# Patient Record
Sex: Male | Born: 1995 | Race: White | Hispanic: No | Marital: Single | Smoking: Former smoker
Health system: Southern US, Community
[De-identification: ages and names within clinical notes are randomized; demographics above are authoritative.]

## PROBLEM LIST (undated history)

## (undated) DIAGNOSIS — K219 Gastro-esophageal reflux disease without esophagitis: Secondary | ICD-10-CM

## (undated) DIAGNOSIS — I10 Essential (primary) hypertension: Secondary | ICD-10-CM

## (undated) DIAGNOSIS — F419 Anxiety disorder, unspecified: Secondary | ICD-10-CM

## (undated) HISTORY — PX: HAND SURGERY: SHX662

## (undated) HISTORY — PX: TONSILLECTOMY: SUR1361

---

## 2018-07-13 ENCOUNTER — Encounter (HOSPITAL_COMMUNITY): Payer: Self-pay | Admitting: Obstetrics and Gynecology

## 2018-07-13 ENCOUNTER — Emergency Department (HOSPITAL_COMMUNITY): Payer: BLUE CROSS/BLUE SHIELD

## 2018-07-13 ENCOUNTER — Emergency Department (HOSPITAL_COMMUNITY)
Admission: EM | Admit: 2018-07-13 | Discharge: 2018-07-13 | Disposition: A | Discharge status: Home / Self Care | Payer: BLUE CROSS/BLUE SHIELD | Attending: Emergency Medicine | Admitting: Emergency Medicine

## 2018-07-13 ENCOUNTER — Other Ambulatory Visit: Payer: Self-pay

## 2018-07-13 DIAGNOSIS — I10 Essential (primary) hypertension: Secondary | ICD-10-CM | POA: Diagnosis not present

## 2018-07-13 DIAGNOSIS — R202 Paresthesia of skin: Secondary | ICD-10-CM | POA: Insufficient documentation

## 2018-07-13 DIAGNOSIS — F17210 Nicotine dependence, cigarettes, uncomplicated: Secondary | ICD-10-CM | POA: Diagnosis not present

## 2018-07-13 HISTORY — DX: Gastro-esophageal reflux disease without esophagitis: K21.9

## 2018-07-13 HISTORY — DX: Anxiety disorder, unspecified: F41.9

## 2018-07-13 HISTORY — DX: Essential (primary) hypertension: I10

## 2018-07-13 LAB — CBC WITH DIFFERENTIAL/PLATELET
Basophils Absolute: 0 10*3/uL (ref 0.0–0.1)
Basophils Relative: 1 %
EOS PCT: 4 %
Eosinophils Absolute: 0.2 10*3/uL (ref 0.0–0.7)
HCT: 45.2 % (ref 39.0–52.0)
Hemoglobin: 15.7 g/dL (ref 13.0–17.0)
LYMPHS ABS: 1.5 10*3/uL (ref 0.7–4.0)
Lymphocytes Relative: 32 %
MCH: 30.7 pg (ref 26.0–34.0)
MCHC: 34.7 g/dL (ref 30.0–36.0)
MCV: 88.5 fL (ref 78.0–100.0)
MONO ABS: 0.4 10*3/uL (ref 0.1–1.0)
MONOS PCT: 9 %
Neutro Abs: 2.7 10*3/uL (ref 1.7–7.7)
Neutrophils Relative %: 56 %
PLATELETS: 220 10*3/uL (ref 150–400)
RBC: 5.11 MIL/uL (ref 4.22–5.81)
RDW: 12.4 % (ref 11.5–15.5)
WBC: 4.9 10*3/uL (ref 4.0–10.5)

## 2018-07-13 LAB — BASIC METABOLIC PANEL
Anion gap: 10 (ref 5–15)
BUN: 10 mg/dL (ref 6–20)
CO2: 27 mmol/L (ref 22–32)
Calcium: 9.4 mg/dL (ref 8.9–10.3)
Chloride: 105 mmol/L (ref 98–111)
Creatinine, Ser: 1.07 mg/dL (ref 0.61–1.24)
GFR calc Af Amer: >60 mL/min (ref >60)
GLUCOSE: 92 mg/dL (ref 70–99)
Potassium: 4.1 mmol/L (ref 3.5–5.1)
Sodium: 142 mmol/L (ref 135–145)

## 2018-07-13 LAB — URINALYSIS, ROUTINE W REFLEX MICROSCOPIC
Bilirubin Urine: NEGATIVE
GLUCOSE, UA: NEGATIVE mg/dL
HGB URINE DIPSTICK: NEGATIVE
Ketones, ur: NEGATIVE mg/dL
Leukocytes, UA: NEGATIVE
Nitrite: NEGATIVE
PROTEIN: NEGATIVE mg/dL
Specific Gravity, Urine: 1.011 (ref 1.005–1.030)
pH: 7 (ref 5.0–8.0)

## 2018-07-13 MED ORDER — DIPHENHYDRAMINE HCL 25 MG PO CAPS
25.0000 mg | ORAL_CAPSULE | Freq: Once | ORAL | Status: AC
  Administered 2018-07-13: 25 mg via ORAL
  Filled 2018-07-13: qty 1

## 2018-07-13 MED ORDER — LORAZEPAM 2 MG/ML IJ SOLN
1.0000 mg | Freq: Once | INTRAMUSCULAR | Status: AC | PRN
  Administered 2018-07-13: 1 mg via INTRAVENOUS
  Filled 2018-07-13: qty 1

## 2018-07-13 MED ORDER — PROCHLORPERAZINE MALEATE 10 MG PO TABS
10.0000 mg | ORAL_TABLET | Freq: Once | ORAL | Status: AC
  Administered 2018-07-13: 10 mg via ORAL
  Filled 2018-07-13: qty 1

## 2018-07-13 MED ORDER — GADOBENATE DIMEGLUMINE 529 MG/ML IV SOLN
20.0000 mL | Freq: Once | INTRAVENOUS | Status: AC | PRN
  Administered 2018-07-13: 17 mL via INTRAVENOUS

## 2018-07-13 NOTE — ED Notes (Addendum)
PA in with pt, unable to place pt on monitor.

## 2018-07-13 NOTE — ED Notes (Signed)
Pt transported to MRI 

## 2018-07-13 NOTE — ED Notes (Signed)
MRI called and stated they would be getting pt soon

## 2018-07-13 NOTE — Discharge Instructions (Signed)
Your blood work and MRI were reassuring.  Please follow up with neurology. Call tomorrow to schedule an appointment. Establish care with a PCP.  If you develop worsening or new concerning symptoms you can return to the emergency department for re-evaluation.

## 2018-07-13 NOTE — ED Triage Notes (Signed)
Per Pt: Pt reports he has been having weird tingling sensations on the left side of his body since yesterday. Pt reports he was trying to see if it would stop so he waited until today to come in. Pt reports it is mostly in the left arm and leg, but he feels it in his face as well. Pt reports he has a hx of Htn and anxiety.  Pt reports he has been drinking and smoking more the past few weeks since he started his [employer] job.  Pt is alert and ambulatory at triage

## 2018-07-13 NOTE — ED Provider Notes (Signed)
[facility]-EMERGENCY DEPT Provider Note   CSN: [id number] Arrival date & time: 07/13/18  1239     History   Chief Complaint Chief Complaint  Patient presents with  . Tingling    Left arm and leg    HPI Dalton Johnson is a 22 y.o. male with a past medical history of hypertension, GERD, anxiety who presents emergency department today for tingling.  Patient reports that he has had intermittent episodes of tingling of his left face, left arm as well as left leg in the last several months.  He reports that yesterday evening he had the onset of his symptoms again but instead of only lasting a few hours if now persistent today.  He notes that there is no associated headache, visual changes, vertigo, dizziness, syncope, facial droop, difficulty with speech, tinnitus, head trauma, fall, chest pain, shortness of breath, cough, abdominal pain, nausea/vomiting/diarrhea, extremity weakness, neck pain.  He reports that he he takes clonidine for hypertension and omeprazole for GERD but no other medications.  He denies any illicit drug use.  He has alcohol use last week but none recently.  He denies any over-the-counter medication.  He does nothing makes symptoms better or worse.  No interventions prior to arrival.  He denies any pain at this present time.  HPI  Past Medical History:  Diagnosis Date  . Anxiety   . GERD (gastroesophageal reflux disease)   . Hypertension     There are no active problems to display for this patient.   Past Surgical History:  Procedure Laterality Date  . HAND SURGERY    . TONSILLECTOMY          Home Medications    Prior to Admission medications   Not on File    Family History No family history on file.  Social History Social History   Tobacco Use  . Smoking status: Current Every Day Smoker    Packs/day: 1.00    Years: 5.00    Pack years: 5.00    Types: Cigarettes  . Smokeless tobacco: Never Used  Substance Use Topics  .  Alcohol use: Yes    Alcohol/week: 5.0 standard drinks    Types: 5 Cans of beer per week  . Drug use: Not Currently     Allergies   Patient has no allergy information on record.   Review of Systems Review of Systems  All other systems reviewed and are negative.    Physical Exam Updated Vital Signs BP (!) 149/99 (BP Location: Left Arm)   Pulse 86   Temp 97.8 F (36.6 C) (Oral)   Resp 16   SpO2 98%   Physical Exam  Constitutional: He appears well-developed and well-nourished.  HENT:  Head: Normocephalic and atraumatic.  Right Ear: External ear normal.  Left Ear: External ear normal.  Nose: Nose normal.  Mouth/Throat: Uvula is midline, oropharynx is clear and moist and mucous membranes are normal. No tonsillar exudate.  Eyes: Pupils are equal, round, and reactive to light. Right eye exhibits no discharge. Left eye exhibits no discharge. No scleral icterus.  Neck: Trachea normal. Neck supple. No spinous process tenderness present. No neck rigidity. Normal range of motion present.  No C-spine tenderness palpation or step-offs.  No carotid bruit.  Cardiovascular: Normal rate, regular rhythm and intact distal pulses.  No murmur heard. Pulses:      Radial pulses are 2+ on the right side, and 2+ on the left side.       Dorsalis  pedis pulses are 2+ on the right side, and 2+ on the left side.       Posterior tibial pulses are 2+ on the right side, and 2+ on the left side.  No lower extremity swelling or edema. Calves symmetric in size bilaterally.  Pulmonary/Chest: Effort normal and breath sounds normal. He exhibits no tenderness.  Abdominal: Soft. Bowel sounds are normal. There is no tenderness. There is no rigidity, no rebound, no guarding and no CVA tenderness.  Musculoskeletal: He exhibits no edema.  Lymphadenopathy:    He has no cervical adenopathy.  Neurological: He is alert.  Mental Status:  Alert, oriented, thought content appropriate, able to give a coherent history.  Speech fluent without evidence of aphasia. Able to follow 2 step commands without difficulty.  Cranial Nerves:  II:  Peripheral visual fields grossly normal, pupils equal, round, reactive to light III,IV, VI: ptosis not present, extra-ocular motions intact bilaterally  V,VII: smile symmetric, eyebrows raise symmetric, facial light touch sensation equal VIII: hearing grossly normal to voice  X: uvula elevates symmetrically  XI: bilateral shoulder shrug symmetric and strong XII: midline tongue extension without fassiculations Motor:  Normal tone. 5/5 in upper and lower extremities bilaterally including strong and equal grip strength and dorsiflexion/plantar flexion Sensory: Sensation intact to light touch in all extremities. Negative Romberg.  Deep Tendon Reflexes: 2+ and symmetric in the biceps and patella Cerebellar: normal finger-to-nose with bilateral upper extremities. Normal heel-to -shin balance bilaterally of the lower extremity. No pronator drift.  Gait: normal gait and balance CV: distal pulses palpable throughout   Skin: Skin is warm and dry. No rash noted. He is not diaphoretic.  Psychiatric: He has a normal mood and affect.  Nursing note and vitals reviewed.    ED Treatments / Results  Labs (all labs ordered are listed, but only abnormal results are displayed) Labs Reviewed - No data to display  EKG None  Radiology Mr Laqueta Jean And Wo Contrast  Result Date: 07/13/2018 CLINICAL DATA:  Intermittent numbness of the left face, upper, and lower extremity over last several months. Left-sided tingling sensation since yesterday. EXAM: MRI HEAD WITHOUT AND WITH CONTRAST TECHNIQUE: Multiplanar, multiecho pulse sequences of the brain and surrounding structures were obtained without and with intravenous contrast. CONTRAST:  17mL MULTIHANCE GADOBENATE DIMEGLUMINE 529 MG/ML IV SOLN COMPARISON:  None. FINDINGS: Brain: No acute infarct, hemorrhage, or mass lesion is present. The ventricles  are of normal size. No significant extraaxial fluid collection is present. No significant white matter disease is present. The brainstem and cerebellum are normal. The postcontrast images demonstrate no pathologic enhancement. Vascular: Flow is present in the major intracranial arteries. Skull and upper cervical spine: The skull base is within normal limits. Craniocervical junction is normal. The upper cervical spine is within normal. Marrow signal is unremarkable. Sinuses/Orbits: Diffuse mucosal thickening is present throughout the paranasal sinuses. No fluid levels are present. Minimal fluid is present in the inferior mastoid air cells. No obstructing nasopharyngeal lesion is present. IMPRESSION: 1. Normal MRI the brain. No acute or focal lesion to explain numbness or tingling sensations. 2. Mild diffuse sinus disease as described. Electronically Signed   By: Marin Roberts M.D.   On: 07/13/2018 16:50    Procedures Procedures (including critical care time)  Medications Ordered in ED Medications  diphenhydrAMINE (BENADRYL) capsule 25 mg (25 mg Oral Given 07/13/18 1350)  prochlorperazine (COMPAZINE) tablet 10 mg (10 mg Oral Given 07/13/18 1350)  LORazepam (ATIVAN) injection 1 mg (1 mg Intravenous Given  07/13/18 1524)  gadobenate dimeglumine (MULTIHANCE) injection 20 mL (17 mLs Intravenous Contrast Given 07/13/18 1616)     Initial Impression / Assessment and Plan / ED Course  I have reviewed the triage vital signs and the nursing notes.  Pertinent labs & imaging results that were available during my care of the patient were reviewed by me and considered in my medical decision making (see chart for details).     22 year old male with intermittent left-sided facial, arm and leg numbness for the last several months.  He reports symptoms started yesterday and have progressed since today.  He denies any visual changes or focal weakness.  He has normal neurologic exam.  Blood work is reassuring.  He  was given Compazine in the department for possible complicated migraine.  After discussion with my attending, Dr. Madilyn Hook will obtain MRI with and without contrast to evaluate.  If negative will have follow-up with neurology as an outpatient.  MRI is negative. These were reviewed by myself. Patient with resolution of symptoms.  Patient feels reassured.  No further work-up indicated. I advised the patient to call neurology follow-up this week. Specific return precautions discussed. Time was given for all questions to be answered. The patient verbalized understanding and agreement with plan. The patient appears safe for discharge home.  Final Clinical Impressions(s) / ED Diagnoses   Final diagnoses:  Tingling    ED Discharge Orders    None       Princella Pellegrini 07/13/18 1749    Tilden Fossa, MD 07/16/18 1434

## 2018-07-13 NOTE — ED Notes (Signed)
Pt aware urine sample needed, states that he cannot give one at this time.

## 2018-07-27 ENCOUNTER — Emergency Department (HOSPITAL_COMMUNITY)
Admission: EM | Admit: 2018-07-27 | Discharge: 2018-07-27 | Disposition: A | Discharge status: Home / Self Care | Payer: BLUE CROSS/BLUE SHIELD | Attending: Emergency Medicine | Admitting: Emergency Medicine

## 2018-07-27 ENCOUNTER — Encounter (HOSPITAL_COMMUNITY): Payer: Self-pay | Admitting: Emergency Medicine

## 2018-07-27 ENCOUNTER — Emergency Department (HOSPITAL_COMMUNITY): Payer: BLUE CROSS/BLUE SHIELD

## 2018-07-27 DIAGNOSIS — R079 Chest pain, unspecified: Secondary | ICD-10-CM | POA: Diagnosis present

## 2018-07-27 DIAGNOSIS — Z79899 Other long term (current) drug therapy: Secondary | ICD-10-CM | POA: Diagnosis not present

## 2018-07-27 DIAGNOSIS — F419 Anxiety disorder, unspecified: Secondary | ICD-10-CM | POA: Insufficient documentation

## 2018-07-27 DIAGNOSIS — I10 Essential (primary) hypertension: Secondary | ICD-10-CM | POA: Insufficient documentation

## 2018-07-27 DIAGNOSIS — R0789 Other chest pain: Secondary | ICD-10-CM | POA: Insufficient documentation

## 2018-07-27 DIAGNOSIS — F17210 Nicotine dependence, cigarettes, uncomplicated: Secondary | ICD-10-CM | POA: Insufficient documentation

## 2018-07-27 LAB — CBC WITH DIFFERENTIAL/PLATELET
BASOS ABS: 0 10*3/uL (ref 0.0–0.1)
BASOS PCT: 0 %
EOS ABS: 0.2 10*3/uL (ref 0.0–0.7)
EOS PCT: 3 %
HCT: 45.1 % (ref 39.0–52.0)
HEMOGLOBIN: 15.7 g/dL (ref 13.0–17.0)
LYMPHS ABS: 1.3 10*3/uL (ref 0.7–4.0)
Lymphocytes Relative: 21 %
MCH: 30.9 pg (ref 26.0–34.0)
MCHC: 34.8 g/dL (ref 30.0–36.0)
MCV: 88.8 fL (ref 78.0–100.0)
Monocytes Absolute: 0.4 10*3/uL (ref 0.1–1.0)
Monocytes Relative: 7 %
Neutro Abs: 4.3 10*3/uL (ref 1.7–7.7)
Neutrophils Relative %: 69 %
PLATELETS: 181 10*3/uL (ref 150–400)
RBC: 5.08 MIL/uL (ref 4.22–5.81)
RDW: 12.7 % (ref 11.5–15.5)
WBC: 6.3 10*3/uL (ref 4.0–10.5)

## 2018-07-27 LAB — I-STAT TROPONIN, ED
TROPONIN I, POC: 0.01 ng/mL (ref 0.00–0.08)
TROPONIN I, POC: 0 ng/mL (ref 0.00–0.08)

## 2018-07-27 LAB — BASIC METABOLIC PANEL
Anion gap: 11 (ref 5–15)
BUN: 11 mg/dL (ref 6–20)
CHLORIDE: 105 mmol/L (ref 98–111)
CO2: 24 mmol/L (ref 22–32)
CREATININE: 1.08 mg/dL (ref 0.61–1.24)
Calcium: 9.7 mg/dL (ref 8.9–10.3)
GFR calc Af Amer: >60 mL/min (ref >60)
Glucose, Bld: 96 mg/dL (ref 70–99)
Potassium: 3.8 mmol/L (ref 3.5–5.1)
SODIUM: 140 mmol/L (ref 135–145)

## 2018-07-27 MED ORDER — ACETAMINOPHEN 325 MG PO TABS
650.0000 mg | ORAL_TABLET | Freq: Once | ORAL | Status: AC
  Administered 2018-07-27: 650 mg via ORAL
  Filled 2018-07-27: qty 2

## 2018-07-27 MED ORDER — LORAZEPAM 1 MG PO TABS
1.0000 mg | ORAL_TABLET | Freq: Once | ORAL | Status: AC
  Administered 2018-07-27: 1 mg via ORAL
  Filled 2018-07-27: qty 1

## 2018-07-27 NOTE — Discharge Instructions (Signed)
Continue your home medications as previously prescribed. Return to ED for worsening symptoms, severe chest pain or abdominal pain, vomiting or coughing up blood, lightheadedness or loss of consciousness.

## 2018-07-27 NOTE — ED Provider Notes (Signed)
[facility]-EMERGENCY DEPT Provider Note   CSN: [id number] Arrival date & time: 07/27/18  1341     History   Chief Complaint Chief Complaint  Patient presents with  . Anxiety  . Drug Use    HPI Dalton Johnson is a 22 y.o. male with a past medical history of anxiety, hypertension who presents to ED for evaluation of left-sided chest pain radiating to back for the past 2 hours.  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  He took 1 dose of his home clonidine in hopes of this helping the symptoms but it has not.  He denies any shortness of breath, cough, recent surgeries, recent prolonged travel, prior PE or MI, abdominal pain, vomiting.  HPI  Past Medical History:  Diagnosis Date  . Anxiety   . GERD (gastroesophageal reflux disease)   . Hypertension     There are no active problems to display for this patient.   Past Surgical History:  Procedure Laterality Date  . HAND SURGERY    . TONSILLECTOMY          Home Medications    Prior to Admission medications   Medication Sig Start Date End Date Taking? Authorizing Provider  cloNIDine (CATAPRES) 0.1 MG tablet Take 0.1 mg by mouth 3 (three) times daily. 04/22/18  Yes [provider]  omeprazole (PRILOSEC) 40 MG capsule Take 40 mg by mouth daily.   Yes [provider]  QUEtiapine (SEROQUEL) 300 MG tablet Take 75-300 mg by mouth at bedtime as needed (sleep).    Yes [provider]    Family History No family history on file.  Social History Social History   Tobacco Use  . Smoking status: Current Every Day Smoker    Packs/day: 1.00    Years: 5.00    Pack years: 5.00    Types: Cigarettes  . Smokeless tobacco: Never Used  Substance Use Topics  . Alcohol use: Yes    Alcohol/week: 5.0 standard drinks    Types: 5 Cans of beer per week  . Drug use: Not Currently     Allergies   Patient has no  known allergies.   Review of Systems Review of Systems  Constitutional: Negative for appetite change, chills and fever.  HENT: Negative for ear pain, rhinorrhea, sneezing and sore throat.   Eyes: Negative for photophobia and visual disturbance.  Respiratory: Negative for cough, chest tightness, shortness of breath and wheezing.   Cardiovascular: Positive for chest pain. Negative for palpitations.  Gastrointestinal: Negative for abdominal pain, blood in stool, constipation, diarrhea, nausea and vomiting.  Genitourinary: Negative for dysuria, hematuria and urgency.  Musculoskeletal: Positive for back pain. Negative for myalgias.  Skin: Negative for rash.  Neurological: Negative for dizziness, weakness and light-headedness.  Psychiatric/Behavioral: The patient is nervous/anxious.      Physical Exam Updated Vital Signs BP (!) 166/96   Pulse 70   Temp 98 F (36.7 C) (Oral)   Resp (!) 8   SpO2 100%   Physical Exam  Constitutional: He appears well-developed and well-nourished. No distress.  Nontoxic appearing. Appears anxious.  HENT:  Head: Normocephalic and atraumatic.  Nose: Nose normal.  Eyes: Pupils are equal, round, and reactive to light. Conjunctivae and EOM are normal. Right eye exhibits no discharge. Left eye exhibits no discharge. No scleral icterus.  Neck: Normal range of motion. Neck supple.  Cardiovascular: Normal rate, regular rhythm,  normal heart sounds and intact distal pulses. Exam reveals no gallop and no friction rub.  No murmur heard. Pulmonary/Chest: Effort normal and breath sounds normal. No respiratory distress.  Abdominal: Soft. Bowel sounds are normal. He exhibits no distension. There is no tenderness. There is no guarding.  Musculoskeletal: Normal range of motion. He exhibits no edema.  No lower extremity edema, erythema or calf tenderness bilaterally.  Neurological: He is alert. He exhibits normal muscle tone. Coordination normal.  Skin: Skin is warm and  dry. No rash noted.  Psychiatric: He has a normal mood and affect.  Nursing note and vitals reviewed.    ED Treatments / Results  Labs (all labs ordered are listed, but only abnormal results are displayed) Labs Reviewed  BASIC METABOLIC PANEL  CBC WITH DIFFERENTIAL/PLATELET  I-STAT TROPONIN, ED  I-STAT TROPONIN, ED    EKG EKG Interpretation  Date/Time:  Sunday July 27 2018 14:50:54 EDT Ventricular Rate:  64 PR Interval:    QRS Duration: 95 QT Interval:  402 QTC Calculation: 415 R Axis:   76 Text Interpretation:  Sinus rhythm Normal ECG No old tracing to compare Confirmed by Miller, Brian ([id number]) on 07/27/2018 4:10:47 PM   Radiology Dg Chest 2 View  Result Date: 07/27/2018 CLINICAL DATA:  Chest pain. EXAM: CHEST - 2 VIEW COMPARISON:  None. FINDINGS: The heart size and mediastinal contours are within normal limits. Both lungs are clear. No pneumothorax or pleural effusion is noted. The visualized skeletal structures are unremarkable. IMPRESSION: No active cardiopulmonary disease. Electronically Signed   By: James  Green Jr, M.D.   On: 07/27/2018 15:25    Procedures Procedures (including critical care time)  Medications Ordered in ED Medications  acetaminophen (TYLENOL) tablet 650 mg (650 mg Oral Given 07/27/18 1718)  LORazepam (ATIVAN) tablet 1 mg (1 mg Oral Given 07/27/18 1718)     Initial Impression / Assessment and Plan / ED Course  I have reviewed the triage vital signs and the nursing notes.  Pertinent labs & imaging results that were available during my care of the patient were reviewed by me and considered in my medical decision making (see chart for details).     22  year old male with past medical history of anxiety, hypertension currently on clonidine who presents to ED for evaluation of left-sided chest pain radiating to back for the past 2 hours.  States that he snorted meth for the first time approximately 4 hours prior to arrival.  This is the first time  he is done this.  He took 1 dose of his home clonidine in hopes of this improving the symptoms.  Denies any prior MI, PE, recent surgeries, recent prolonged travel, shortness of breath, abdominal pain or vomiting.  On exam patient appears very anxious.  Tenderness to palpation of the left side of the chest.  No abdominal tenderness to palpation.  He is hypertensive here.  Initial and delta troponin negative.  CBC, BMP unremarkable.  EKG shows normal sinus rhythm.  Chest x-ray is unremarkable.  Patient given Tylenol and Ativan with significant improvement in his symptoms.  He still appears anxious which I believe is the cause of his hypertension.  I had a discussion with the patient regarding discontinuing any recreational drug use and to establish primary care with a provider.  Patient is agreeable to this.  He is not seen a primary care provider in several years.  At this time I do not believe any cardiac or pulmonary cause of his symptoms.  He is  PERC negative. Will advise him to return to ED for any severe worsening symptoms. Patient discussed with Dr. Juleen China.  Portions of this note were generated with Scientist, clinical (histocompatibility and immunogenetics). Dictation errors may occur despite best attempts at proofreading.   Final Clinical Impressions(s) / ED Diagnoses   Final diagnoses:  Anxiety  Chest wall pain    ED Discharge Orders    None       Dietrich Pates, PA-C 07/27/18 1813    Raeford Razor, MD 07/31/18 1444

## 2018-07-27 NOTE — ED Triage Notes (Addendum)
Patient here from home via EMS with complaints of anxiety after doing meth for the first time 2 hours ago. Also reports shoulder and back pain.

## 2018-12-04 ENCOUNTER — Encounter (HOSPITAL_COMMUNITY): Payer: Self-pay | Admitting: Emergency Medicine

## 2018-12-04 ENCOUNTER — Emergency Department (HOSPITAL_COMMUNITY)
Admission: EM | Admit: 2018-12-04 | Discharge: 2018-12-04 | Disposition: A | Discharge status: Home / Self Care | Payer: Self-pay | Attending: Emergency Medicine | Admitting: Emergency Medicine

## 2018-12-04 ENCOUNTER — Other Ambulatory Visit: Payer: Self-pay

## 2018-12-04 ENCOUNTER — Emergency Department (HOSPITAL_COMMUNITY): Payer: Self-pay

## 2018-12-04 DIAGNOSIS — R202 Paresthesia of skin: Secondary | ICD-10-CM | POA: Insufficient documentation

## 2018-12-04 DIAGNOSIS — R0789 Other chest pain: Secondary | ICD-10-CM | POA: Insufficient documentation

## 2018-12-04 DIAGNOSIS — R002 Palpitations: Secondary | ICD-10-CM | POA: Insufficient documentation

## 2018-12-04 DIAGNOSIS — F419 Anxiety disorder, unspecified: Secondary | ICD-10-CM | POA: Insufficient documentation

## 2018-12-04 DIAGNOSIS — R079 Chest pain, unspecified: Secondary | ICD-10-CM

## 2018-12-04 DIAGNOSIS — Z79899 Other long term (current) drug therapy: Secondary | ICD-10-CM | POA: Insufficient documentation

## 2018-12-04 DIAGNOSIS — F17200 Nicotine dependence, unspecified, uncomplicated: Secondary | ICD-10-CM | POA: Insufficient documentation

## 2018-12-04 DIAGNOSIS — I10 Essential (primary) hypertension: Secondary | ICD-10-CM | POA: Insufficient documentation

## 2018-12-04 LAB — BASIC METABOLIC PANEL
ANION GAP: 13 (ref 5–15)
BUN: 9 mg/dL (ref 6–20)
CHLORIDE: 101 mmol/L (ref 98–111)
CO2: 24 mmol/L (ref 22–32)
Calcium: 9.2 mg/dL (ref 8.9–10.3)
Creatinine, Ser: 1.2 mg/dL (ref 0.61–1.24)
GFR calc non Af Amer: >60 mL/min (ref >60)
GLUCOSE: 131 mg/dL — AB (ref 70–99)
Potassium: 3.4 mmol/L — ABNORMAL LOW (ref 3.5–5.1)
Sodium: 138 mmol/L (ref 135–145)

## 2018-12-04 LAB — CBC
HEMATOCRIT: 48.9 % (ref 39.0–52.0)
Hemoglobin: 16.3 g/dL (ref 13.0–17.0)
MCH: 31 pg (ref 26.0–34.0)
MCHC: 33.3 g/dL (ref 30.0–36.0)
MCV: 93 fL (ref 80.0–100.0)
NRBC: 0 % (ref 0.0–0.2)
Platelets: 226 10*3/uL (ref 150–400)
RBC: 5.26 MIL/uL (ref 4.22–5.81)
RDW: 12.7 % (ref 11.5–15.5)
WBC: 8.3 10*3/uL (ref 4.0–10.5)

## 2018-12-04 LAB — POCT I-STAT TROPONIN I: Troponin i, poc: 0 ng/mL (ref 0.00–0.08)

## 2018-12-04 LAB — D-DIMER, QUANTITATIVE: D-Dimer, Quant: <0.27 ug/mL-FEU (ref 0.00–0.50)

## 2018-12-04 MED ORDER — CLONIDINE HCL 0.1 MG PO TABS
0.1000 mg | ORAL_TABLET | Freq: Once | ORAL | Status: AC
  Administered 2018-12-04: 0.1 mg via ORAL
  Filled 2018-12-04: qty 1

## 2018-12-04 MED ORDER — HYDROXYZINE HCL 25 MG PO TABS: 25.0000 mg | ORAL_TABLET | Freq: Four times a day (QID) | ORAL | 0 refills | Status: DC

## 2018-12-04 MED FILL — hydrOXYzine HCL 25 MG TABS: 25 | 3 days supply | Qty: 12 | Fill #0

## 2018-12-04 NOTE — ED Provider Notes (Signed)
[facility]-EMERGENCY DEPT Provider Note   CSN: [id number] Arrival date & time: 12/04/18  1109     History   Chief Complaint Chief Complaint  Patient presents with  . Chest Pain    HPI Dalton Johnson is a 23 y.o. male.  HPI  Patient is a 23 year old male with a history of anxiety, hypertension, and GERD presenting for left-sided chest pain.  He reports it is a "weird" sensation.  He reports that it feels like a fluttering and palpitations.  He reports he has had some paresthesias in his left hand.  He reports that it began overnight.  He denies any exertional symptoms, shortness of breath.  He does report that it feels worse when he takes a deep breath.  Patient reports smoking history of marijuana, but no cigarettes.  He reports is been many months since he tried illicit drugs, previously tried meth 1 time.  Denies any recent meth or cocaine use.  Patient denies any dizziness, lightheadedness, syncope or presyncope.  Denies any personal history of DVT/PE, hormone use, cancer treatment, lower extremity or calf tenderness, hemoptysis, cough, recent immobilization, hospitalization, recent surgery. No remedies prior to arrival for symptoms.  Patient does report that he was prescribed clonidine for his hypertension due to his concomitant anxiety.  Reports he has not followed up with a primary care provider in a few years for routine care.  Past Medical History:  Diagnosis Date  . Anxiety   . GERD (gastroesophageal reflux disease)   . Hypertension     There are no active problems to display for this patient.   Past Surgical History:  Procedure Laterality Date  . HAND SURGERY    . TONSILLECTOMY          Home Medications    Prior to Admission medications   Medication Sig Start Date End Date Taking? Authorizing Provider  cloNIDine (CATAPRES) 0.1 MG tablet Take 0.1 mg by mouth 3 (three) times daily. 04/22/18  Yes [provider]  omeprazole (PRILOSEC)  40 MG capsule Take 40 mg by mouth daily.   Yes [provider]  QUEtiapine (SEROQUEL) 300 MG tablet Take 75-150 mg by mouth at bedtime as needed (sleep).    Yes [provider]  hydrOXYzine (ATARAX/VISTARIL) 25 MG tablet Take 1 tablet (25 mg total) by mouth every 6 (six) hours. 12/04/18   Elisha Ponder, PA-C    Family History No family history on file.  Social History Social History   Tobacco Use  . Smoking status: Current Every Day Smoker    Packs/day: 1.00    Years: 5.00    Pack years: 5.00    Types: Cigarettes  . Smokeless tobacco: Never Used  Substance Use Topics  . Alcohol use: Yes    Alcohol/week: 5.0 standard drinks    Types: 5 Cans of beer per week  . Drug use: Not Currently     Allergies   Patient has no known allergies.   Review of Systems Review of Systems  Constitutional: Negative for chills and fever.  HENT: Negative for congestion and sore throat.   Eyes: Negative for visual disturbance.  Respiratory: Negative for cough, chest tightness and shortness of breath.   Cardiovascular: Positive for chest pain and palpitations. Negative for leg swelling.  Gastrointestinal: Negative for abdominal pain, nausea and vomiting.  Genitourinary: Negative for dysuria and flank pain.  Musculoskeletal: Negative for back pain and myalgias.  Skin: Negative for rash.  Neurological: Negative for dizziness, syncope, light-headedness and  headaches.     Physical Exam Updated Vital Signs BP (!) 163/94   Pulse 74   Temp 98.2 F (36.8 C) (Oral)   Resp (!) 7   Ht 6\' 1"  (1.854 m)   Wt 77.6 kg   SpO2 100%   BMI 22.56 kg/m   Physical Exam Vitals signs and nursing note reviewed.  Constitutional:      General: He is not in acute distress.    Appearance: He is well-developed.     Comments: Anxious appearing  HENT:     Head: Normocephalic and atraumatic.  Eyes:     Conjunctiva/sclera: Conjunctivae normal.     Pupils: Pupils are equal, round, and  reactive to light.     Comments: Pupils 4mm and equal.   Neck:     Musculoskeletal: Normal range of motion and neck supple.  Cardiovascular:     Rate and Rhythm: Normal rate and regular rhythm.     Pulses:          Radial pulses are 2+ on the right side and 2+ on the left side.       Dorsalis pedis pulses are 2+ on the right side and 2+ on the left side.     Heart sounds: S1 normal and S2 normal. No murmur.     Comments: No lower extremity edema.  No calf tenderness. Pulmonary:     Effort: Pulmonary effort is normal.     Breath sounds: Normal breath sounds. No wheezing or rales.  Abdominal:     General: There is no distension.     Palpations: Abdomen is soft.     Tenderness: There is no abdominal tenderness. There is no guarding.  Musculoskeletal: Normal range of motion.        General: No deformity.  Lymphadenopathy:     Cervical: No cervical adenopathy.  Skin:    General: Skin is warm and dry.     Findings: No erythema or rash.  Neurological:     Mental Status: He is alert.     Comments: Cranial nerves grossly intact. Patient moves extremities symmetrically and with good coordination.  Psychiatric:        Behavior: Behavior normal.        Thought Content: Thought content normal.        Judgment: Judgment normal.      ED Treatments / Results  Labs (all labs ordered are listed, but only abnormal results are displayed) Labs Reviewed  BASIC METABOLIC PANEL - Abnormal; Notable for the following components:      Result Value   Potassium 3.4 (*)    Glucose, Bld 131 (*)    All other components within normal limits  CBC  D-DIMER, QUANTITATIVE (NOT AT [facility])  I-STAT TROPONIN, ED  POCT I-STAT TROPONIN I    EKG EKG Interpretation  Date/Time:  Thursday December 04 2018 11:20:04 EST Ventricular Rate:  126 PR Interval:    QRS Duration: 91 QT Interval:  411 QTC Calculation: 596 R Axis:   94 Text Interpretation:  Sinus tachycardia Borderline right axis deviation Prolonged  QT interval Confirmed by Benjiman CorePickering, Nathan [phone]([id number]) on 12/04/2018 3:10:03 PM   Radiology Dg Chest 2 View  Result Date: 12/04/2018 CLINICAL DATA:  Left lower chest and back pain and left arm pain. History of hypertension, history of smoking. EXAM: CHEST - 2 VIEW COMPARISON:  PA and lateral chest x-ray of July 27, 2018 FINDINGS: The lungs are mildly hyperinflated. There is no focal infiltrate. There is no  pleural effusion. The heart and pulmonary vascularity are normal. The mediastinum is normal in width. The bony thorax exhibits no acute abnormality. IMPRESSION: Mild hyperinflation may be voluntary or may reflect underlying reactive airway disease. No alveolar pneumonia nor other acute cardiopulmonary abnormality. The observed portions of the bony thorax are normal. Electronically Signed   By: David  [place] M.D.   On: 12/04/2018 11:35    Procedures Procedures (including critical care time)  Medications Ordered in ED Medications  cloNIDine (CATAPRES) tablet 0.1 mg (0.1 mg Oral Given 12/04/18 1611)     Initial Impression / Assessment and Plan / ED Course  I have reviewed the triage vital signs and the nursing notes.  Pertinent labs & imaging results that were available during my care of the patient were reviewed by me and considered in my medical decision making (see chart for details).  Clinical Course as of Dec 04 1756  Thu Dec 04, 2018  1458 D-Dimer, Quant: <0.27 [AM]  1756 Feel that this was charted in error. Pt assessed just prior to reading and did not have bradypnea on exam. No evidence of respiratory distress. This was documented after discharge orders placed.   Resp(!): 7 [AM]    Clinical Course User Index [AM] Elisha Ponder, PA-C    Differential diagnosis includes ACS, PE, thoracic aortic dissection, Boerhaave's syndrome, cardiac tamponade, pneumothorax, incarcerated diaphragmatic hernia, cholecystitis, esophageal spasm, gastroesophageal reflux, herpes zoster of the thorax,  pericarditis, pneumonia, chest wall pain, costochondritis.   Doubt ACS, as troponin is negative and heart sore 0. Doubt PE as Well's score 1 (initial tachycardia) and PERC negative, patient had negative d-dimer. Doubt TAD by hx, CXR showed no widening mediastinum, and pulses equal in all extremities. Patient remained nontoxic appearing and in no acute distress during emergency department course. Vital signs stable in the emergency department.  Patient had a couple Protopic readings, however he did not appear to be somnolent or have any signs of respiratory distress on exam.  Patient had no evidence of respiratory depression suggestive of opioid ingestion, in addition to he had large pupils.  Patient had hypertensive readings, consistent with his history of hypertension.  I discussed with patient that clonidine can cause rebound tachycardia rebound hypertension, and he may need a more optimal blood pressure agent.  Recommended PCP follow-up.  Pericarditis less likely due to no preceding infectious symptoms and pain not improved in upright positions. No abnormal labs. Ambulatory referral placed to cardiology to follow-up regarding palpitations.  Patient and family understand and are in agreement with plan of care.  Final Clinical Impressions(s) / ED Diagnoses   Final diagnoses:  Left-sided chest pain  Palpitations  Elevated blood pressure reading in office with diagnosis of hypertension    ED Discharge Orders         Ordered    Ambulatory referral to Cardiology    Comments:  Palpitations.   12/04/18 1527    hydrOXYzine (ATARAX/VISTARIL) 25 MG tablet  Every 6 hours     12/04/18 1527           Delia Chimes 12/04/18 Juleen China, MD 12/05/18 [phone]

## 2018-12-04 NOTE — Discharge Instructions (Signed)
Please read and follow all provided instructions.  Your diagnoses today include:  1. Left-sided chest pain   2. Palpitations   3. Elevated blood pressure reading in office with diagnosis of hypertension     Tests performed today include: An EKG of your heart A chest x-ray Cardiac enzymes - a blood test for heart muscle damage Blood counts and electrolytes Vital signs. See below for your results today.   Medications prescribed:   Take any prescribed medications only as directed.  You are prescribed Vistaril.  This is a medication to help with anxiety.  Please take it every 6 hours as needed.  This similar to Benadryl, so can make you sleepy.  Please do not drive, drink alcohol, or operate machine-year-old taking this medication.  Follow-up instructions: Please follow-up with your primary care provider as soon as you can for further evaluation of your symptoms.   Return instructions:  SEEK IMMEDIATE MEDICAL ATTENTION IF: You have severe chest pain, especially if the pain is crushing or pressure-like and spreads to the arms, back, neck, or jaw, or if you have sweating, nausea (feeling sick to your stomach), or shortness of breath. THIS IS AN EMERGENCY. Don't wait to see if the pain will go away. Get medical help at once. Call 911 or 0 (operator). DO NOT drive yourself to the hospital.  Your chest pain gets worse and does not go away with rest.  You have an attack of chest pain lasting longer than usual, despite rest and treatment with the medications your caregiver has prescribed.  You wake from sleep with chest pain or shortness of breath. You feel dizzy or faint. You have chest pain not typical of your usual pain for which you originally saw your caregiver.  You have any other emergent concerns regarding your health.  Additional Information: Chest pain comes from many different causes. Your caregiver has diagnosed you as having chest pain that is not specific for one problem, but  does not require admission.  You are at low risk for an acute heart condition or other serious illness.   Your vital signs today were: BP (!) 151/97    Pulse 75    Temp 98.2 F (36.8 C) (Oral)    Resp 16    Ht 6\' 1"  (1.854 m)    Wt 77.6 kg    SpO2 100%    BMI 22.56 kg/m  If your blood pressure (BP) was elevated above 135/85 this visit, please have this repeated by your doctor within one month. --------------

## 2018-12-04 NOTE — ED Triage Notes (Signed)
Pt c/o left sided chest pains that radiates to back that started this morning. Reports having anxiety and HTN so tried to make sure it wasn't do to that.

## 2018-12-15 ENCOUNTER — Ambulatory Visit: Payer: Self-pay | Admitting: Cardiology

## 2019-01-23 ENCOUNTER — Other Ambulatory Visit: Payer: Self-pay

## 2019-01-23 ENCOUNTER — Observation Stay (HOSPITAL_COMMUNITY)
Admission: EM | Admit: 2019-01-23 | Discharge: 2019-01-23 | Discharge status: Against Medical Advice | Payer: Self-pay | Attending: Internal Medicine | Admitting: Internal Medicine

## 2019-01-23 ENCOUNTER — Encounter (HOSPITAL_COMMUNITY): Payer: Self-pay | Admitting: Emergency Medicine

## 2019-01-23 ENCOUNTER — Emergency Department (HOSPITAL_COMMUNITY): Payer: Self-pay

## 2019-01-23 DIAGNOSIS — F419 Anxiety disorder, unspecified: Secondary | ICD-10-CM | POA: Insufficient documentation

## 2019-01-23 DIAGNOSIS — T401X1A Poisoning by heroin, accidental (unintentional), initial encounter: Principal | ICD-10-CM | POA: Insufficient documentation

## 2019-01-23 DIAGNOSIS — I10 Essential (primary) hypertension: Secondary | ICD-10-CM

## 2019-01-23 DIAGNOSIS — L0390 Cellulitis, unspecified: Secondary | ICD-10-CM | POA: Diagnosis present

## 2019-01-23 DIAGNOSIS — T401X4A Poisoning by heroin, undetermined, initial encounter: Secondary | ICD-10-CM

## 2019-01-23 DIAGNOSIS — X58XXXA Exposure to other specified factors, initial encounter: Secondary | ICD-10-CM | POA: Insufficient documentation

## 2019-01-23 DIAGNOSIS — F17210 Nicotine dependence, cigarettes, uncomplicated: Secondary | ICD-10-CM | POA: Insufficient documentation

## 2019-01-23 DIAGNOSIS — L03113 Cellulitis of right upper limb: Secondary | ICD-10-CM | POA: Insufficient documentation

## 2019-01-23 DIAGNOSIS — L539 Erythematous condition, unspecified: Secondary | ICD-10-CM | POA: Insufficient documentation

## 2019-01-23 DIAGNOSIS — D72829 Elevated white blood cell count, unspecified: Secondary | ICD-10-CM | POA: Insufficient documentation

## 2019-01-23 DIAGNOSIS — R000 Tachycardia, unspecified: Secondary | ICD-10-CM | POA: Insufficient documentation

## 2019-01-23 DIAGNOSIS — K219 Gastro-esophageal reflux disease without esophagitis: Secondary | ICD-10-CM | POA: Insufficient documentation

## 2019-01-23 DIAGNOSIS — Z79899 Other long term (current) drug therapy: Secondary | ICD-10-CM | POA: Insufficient documentation

## 2019-01-23 DIAGNOSIS — R21 Rash and other nonspecific skin eruption: Secondary | ICD-10-CM

## 2019-01-23 DIAGNOSIS — Z8249 Family history of ischemic heart disease and other diseases of the circulatory system: Secondary | ICD-10-CM | POA: Insufficient documentation

## 2019-01-23 DIAGNOSIS — L03114 Cellulitis of left upper limb: Secondary | ICD-10-CM | POA: Insufficient documentation

## 2019-01-23 DIAGNOSIS — F1910 Other psychoactive substance abuse, uncomplicated: Secondary | ICD-10-CM | POA: Diagnosis present

## 2019-01-23 DIAGNOSIS — L03119 Cellulitis of unspecified part of limb: Secondary | ICD-10-CM

## 2019-01-23 DIAGNOSIS — N179 Acute kidney failure, unspecified: Secondary | ICD-10-CM | POA: Insufficient documentation

## 2019-01-23 LAB — URINALYSIS, ROUTINE W REFLEX MICROSCOPIC
Bilirubin Urine: NEGATIVE
Glucose, UA: NEGATIVE mg/dL
KETONES UR: 5 mg/dL — AB
Leukocytes,Ua: NEGATIVE
Nitrite: NEGATIVE
PH: 5 (ref 5.0–8.0)
Protein, ur: 30 mg/dL — AB
Specific Gravity, Urine: 1.009 (ref 1.005–1.030)

## 2019-01-23 LAB — CBC WITH DIFFERENTIAL/PLATELET
Abs Immature Granulocytes: 0.07 10*3/uL (ref 0.00–0.07)
Basophils Absolute: 0.1 10*3/uL (ref 0.0–0.1)
Basophils Relative: 0 %
Eosinophils Absolute: 0 10*3/uL (ref 0.0–0.5)
Eosinophils Relative: 0 %
HCT: 49.5 % (ref 39.0–52.0)
Hemoglobin: 16.1 g/dL (ref 13.0–17.0)
Immature Granulocytes: 0 %
Lymphocytes Relative: 6 %
Lymphs Abs: 1.2 10*3/uL (ref 0.7–4.0)
MCH: 30.2 pg (ref 26.0–34.0)
MCHC: 32.5 g/dL (ref 30.0–36.0)
MCV: 92.9 fL (ref 80.0–100.0)
MONO ABS: 1.2 10*3/uL — AB (ref 0.1–1.0)
Monocytes Relative: 6 %
Neutro Abs: 16.9 10*3/uL — ABNORMAL HIGH (ref 1.7–7.7)
Neutrophils Relative %: 88 %
Platelets: 244 10*3/uL (ref 150–400)
RBC: 5.33 MIL/uL (ref 4.22–5.81)
RDW: 12.5 % (ref 11.5–15.5)
WBC: 19.4 10*3/uL — AB (ref 4.0–10.5)
nRBC: 0 % (ref 0.0–0.2)

## 2019-01-23 LAB — COMPREHENSIVE METABOLIC PANEL
ALT: 28 U/L (ref 0–44)
AST: 41 U/L (ref 15–41)
Albumin: 5 g/dL (ref 3.5–5.0)
Alkaline Phosphatase: 83 U/L (ref 38–126)
Anion gap: 15 (ref 5–15)
BUN: 11 mg/dL (ref 6–20)
CO2: 21 mmol/L — ABNORMAL LOW (ref 22–32)
Calcium: 9.3 mg/dL (ref 8.9–10.3)
Chloride: 103 mmol/L (ref 98–111)
Creatinine, Ser: 1.34 mg/dL — ABNORMAL HIGH (ref 0.61–1.24)
GFR calc Af Amer: >60 mL/min (ref >60)
GFR calc non Af Amer: >60 mL/min (ref >60)
Glucose, Bld: 143 mg/dL — ABNORMAL HIGH (ref 70–99)
POTASSIUM: 3.5 mmol/L (ref 3.5–5.1)
Sodium: 139 mmol/L (ref 135–145)
Total Bilirubin: 1 mg/dL (ref 0.3–1.2)
Total Protein: 7.5 g/dL (ref 6.5–8.1)

## 2019-01-23 LAB — CBG MONITORING, ED: Glucose-Capillary: 123 mg/dL — ABNORMAL HIGH (ref 70–99)

## 2019-01-23 LAB — RAPID URINE DRUG SCREEN, HOSP PERFORMED
Amphetamines: NOT DETECTED
BENZODIAZEPINES: POSITIVE — AB
Barbiturates: NOT DETECTED
Cocaine: NOT DETECTED
Opiates: POSITIVE — AB
Tetrahydrocannabinol: POSITIVE — AB

## 2019-01-23 LAB — SALICYLATE LEVEL: Salicylate Lvl: <7 mg/dL (ref 2.8–30.0)

## 2019-01-23 LAB — ETHANOL: Alcohol, Ethyl (B): <10 mg/dL (ref <10)

## 2019-01-23 LAB — ACETAMINOPHEN LEVEL: Acetaminophen (Tylenol), Serum: <10 ug/mL — ABNORMAL LOW (ref 10–30)

## 2019-01-23 LAB — CK: CK TOTAL: 336 U/L (ref 49–397)

## 2019-01-23 MED ORDER — SODIUM CHLORIDE 0.9 % IV BOLUS
1000.0000 mL | Freq: Once | INTRAVENOUS | Status: AC
  Administered 2019-01-23: 1000 mL via INTRAVENOUS

## 2019-01-23 MED ORDER — PIPERACILLIN-TAZOBACTAM 3.375 G IVPB 30 MIN
3.3750 g | Freq: Once | INTRAVENOUS | Status: AC
  Administered 2019-01-23: 3.375 g via INTRAVENOUS
  Filled 2019-01-23: qty 50

## 2019-01-23 MED ORDER — ACETAMINOPHEN 325 MG PO TABS: 650.0000 mg | ORAL_TABLET | Freq: Four times a day (QID) | ORAL | Status: DC | PRN

## 2019-01-23 MED ORDER — ONDANSETRON HCL 4 MG/2ML IJ SOLN: 4.0000 mg | Freq: Four times a day (QID) | INTRAMUSCULAR | Status: DC | PRN

## 2019-01-23 MED ORDER — VANCOMYCIN HCL IN DEXTROSE 1-5 GM/200ML-% IV SOLN
1000.0000 mg | Freq: Once | INTRAVENOUS | Status: AC
  Administered 2019-01-23: 1000 mg via INTRAVENOUS
  Filled 2019-01-23: qty 200

## 2019-01-23 MED ORDER — NALOXONE HCL 2 MG/2ML IJ SOSY
2.0000 mg | PREFILLED_SYRINGE | Freq: Once | INTRAMUSCULAR | Status: AC
  Administered 2019-01-23: 2 mg via INTRAVENOUS
  Filled 2019-01-23: qty 2

## 2019-01-23 MED ORDER — ONDANSETRON HCL 4 MG PO TABS: 4.0000 mg | ORAL_TABLET | Freq: Four times a day (QID) | ORAL | Status: DC | PRN

## 2019-01-23 MED ORDER — VANCOMYCIN HCL 10 G IV SOLR
1250.0000 mg | Freq: Two times a day (BID) | INTRAVENOUS | Status: DC
  Filled 2019-01-23: qty 1250

## 2019-01-23 MED ORDER — ACETAMINOPHEN 650 MG RE SUPP: 650.0000 mg | Freq: Four times a day (QID) | RECTAL | Status: DC | PRN

## 2019-01-23 MED ORDER — TETANUS-DIPHTH-ACELL PERTUSSIS 5-2.5-18.5 LF-MCG/0.5 IM SUSP
0.5000 mL | Freq: Once | INTRAMUSCULAR | Status: AC
  Administered 2019-01-23: 0.5 mL via INTRAMUSCULAR
  Filled 2019-01-23: qty 0.5

## 2019-01-23 NOTE — Progress Notes (Signed)
Pharmacy Antibiotic Note  Dalton Johnson is a 23 y.o. 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  Subsequently found to have cellulitis on hands.  Pharmacy has been consulted for vancomycin dosing.  Plan: Vancomycin 1gm IV x 1 in ED then 1250mg  q12h (AUC 518.6, Scr 1.34) Follow renal function, cultures and clinical course  Height: 6\' 2"  (188 cm) Weight: 175 lb (79.4 kg) IBW/kg (Calculated) : 82.2  Temp (24hrs), Avg:98.4 F (36.9 C), Min:98.4 F (36.9 C), Max:98.4 F (36.9 C)  Recent Labs  Lab 01/23/19 0337  WBC 19.4*  CREATININE 1.34*    Estimated Creatinine Clearance: 96.3 mL/min (A) (by C-G formula based on SCr of 1.34 mg/dL (H)).    No Known Allergies  Antimicrobials this admission: 2/21 zosyn x 1 2/21 vanc >>   Dose adjustments this admission:   Microbiology results: 2/21 BCx:   Thank you for allowing pharmacy to be a part of this patient's care.  Arley Phenix RPh 01/23/2019, 9:18 AM Pager [phone]

## 2019-01-23 NOTE — ED Notes (Signed)
ED TO INPATIENT HANDOFF REPORT  Name/Age/Gender Dalton Johnson 23 y.o. male  Code Status    Code Status Orders  (From admission, onward)         Start     Ordered   01/23/19 0739  Full code  Continuous     01/23/19 0738        Code Status History    This patient has a current code status but no historical code status.      Home/SNF/Other Home  Chief Complaint Drug Overdose  Level of Care/Admitting Diagnosis ED Disposition    ED Disposition Condition Comment   Admit  Hospital Area: [facility] [[id number]]  Level of Care: Med-Surg [16]  Diagnosis: Cellulitis [660600]  Admitting Physician: Osvaldo Shipper [[id number]]  Attending Physician: Osvaldo Shipper [[id number]]  PT Class (Do Not Modify): Observation [104]  PT Acc Code (Do Not Modify): Observation [10022]       Medical History Past Medical History:  Diagnosis Date  . Anxiety   . GERD (gastroesophageal reflux disease)   . Hypertension     Allergies No Known Allergies  IV Location/Drains/Wounds Patient Lines/Drains/Airways Status   Active Line/Drains/Airways    Name:   Placement date:   Placement time:   Site:   Days:   Peripheral IV 01/23/19 Left Antecubital   01/23/19    0431    Antecubital   less than 1          Labs/Imaging Results for orders placed or performed during the hospital encounter of 01/23/19 (from the past 48 hour(s))  CBG monitoring, ED     Status: Abnormal   Collection Time: 01/23/19  3:29 AM  Result Value Ref Range   Glucose-Capillary 123 (H) 70 - 99 mg/dL  Comprehensive metabolic panel     Status: Abnormal   Collection Time: 01/23/19  3:37 AM  Result Value Ref Range   Sodium 139 135 - 145 mmol/L   Potassium 3.5 3.5 - 5.1 mmol/L   Chloride 103 98 - 111 mmol/L   CO2 21 (L) 22 - 32 mmol/L   Glucose, Bld 143 (H) 70 - 99 mg/dL   BUN 11 6 - 20 mg/dL   Creatinine, Ser 4.59 (H) 0.61 - 1.24 mg/dL   Calcium 9.3 8.9 - 97.7 mg/dL   Total Protein 7.5 6.5 - 8.1 g/dL   Albumin 5.0 3.5 - 5.0 g/dL   AST 41 15 - 41 U/L   ALT 28 0 - 44 U/L   Alkaline Phosphatase 83 38 - 126 U/L   Total Bilirubin 1.0 0.3 - 1.2 mg/dL   GFR calc non Af Amer >60 >60 mL/min   GFR calc Af Amer >60 >60 mL/min   Anion gap 15 5 - 15    Comment: Performed at [facility], [address], [place], [place] [address]  Salicylate level     Status: None   Collection Time: 01/23/19  3:37 AM  Result Value Ref Range   Salicylate Lvl <7.0 2.8 - 30.0 mg/dL    Comment: Performed at [facility], [address], [place], [place] [address]  Acetaminophen level     Status: Abnormal   Collection Time: 01/23/19  3:37 AM  Result Value Ref Range   Acetaminophen (Tylenol), Serum <10 (L) 10 - 30 ug/mL    Comment: (NOTE) Therapeutic concentrations vary significantly. A range of 10-30 ug/mL  may be an effective concentration for many patients. However, some  are best treated at concentrations  outside of this range. Acetaminophen concentrations >150 ug/mL at 4 hours after ingestion  and >50 ug/mL at 12 hours after ingestion are often associated with  toxic reactions. Performed at [facility], [address] [place], [place] [address]   Ethanol     Status: None   Collection Time: 01/23/19  3:37 AM  Result Value Ref Range   Alcohol, Ethyl (B) <10 <10 mg/dL    Comment: (NOTE) Lowest detectable limit for serum alcohol is 10 mg/dL. For medical purposes only. Performed at [facility], [address], [place], [place] [address]   CBC WITH DIFFERENTIAL     Status: Abnormal   Collection Time: 01/23/19  3:37 AM  Result Value Ref Range   WBC 19.4 (H) 4.0 - 10.5 K/uL   RBC 5.33 4.22 - 5.81 MIL/uL   Hemoglobin 16.1 13.0 - 17.0 g/dL   HCT 75.7 97.2 - 82.0 %   MCV 92.9 80.0 - 100.0 fL   MCH 30.2 26.0 - 34.0 pg   MCHC 32.5 30.0 - 36.0 g/dL   RDW 60.1 56.1 - 53.7 %   Platelets 244 150 - 400 K/uL   nRBC 0.0 0.0 - 0.2 %    Neutrophils Relative % 88 %   Neutro Abs 16.9 (H) 1.7 - 7.7 K/uL   Lymphocytes Relative 6 %   Lymphs Abs 1.2 0.7 - 4.0 K/uL   Monocytes Relative 6 %   Monocytes Absolute 1.2 (H) 0.1 - 1.0 K/uL   Eosinophils Relative 0 %   Eosinophils Absolute 0.0 0.0 - 0.5 K/uL   Basophils Relative 0 %   Basophils Absolute 0.1 0.0 - 0.1 K/uL   Immature Granulocytes 0 %   Abs Immature Granulocytes 0.07 0.00 - 0.07 K/uL    Comment: Performed at [facility], [address], [place], [place] [address]  CK     Status: None   Collection Time: 01/23/19  3:37 AM  Result Value Ref Range   Total CK 336 49 - 397 U/L    Comment: Performed at [facility], [address], [place], [place] [address]  Urinalysis, Routine w reflex microscopic     Status: Abnormal   Collection Time: 01/23/19  4:49 AM  Result Value Ref Range   Color, Urine YELLOW YELLOW   APPearance CLEAR CLEAR   Specific Gravity, Urine 1.009 1.005 - 1.030   pH 5.0 5.0 - 8.0   Glucose, UA NEGATIVE NEGATIVE mg/dL   Hgb urine dipstick SMALL (A) NEGATIVE   Bilirubin Urine NEGATIVE NEGATIVE   Ketones, ur 5 (A) NEGATIVE mg/dL   Protein, ur 30 (A) NEGATIVE mg/dL   Nitrite NEGATIVE NEGATIVE   Leukocytes,Ua NEGATIVE NEGATIVE   RBC / HPF 0-5 0 - 5 RBC/hpf   WBC, UA 0-5 0 - 5 WBC/hpf   Bacteria, UA RARE (A) NONE SEEN   Squamous Epithelial / LPF 0-5 0 - 5   Mucus PRESENT    Hyaline Casts, UA PRESENT     Comment: Performed at [facility], [address], [place], [place] [address]  Urine rapid drug screen (hosp performed)     Status: Abnormal   Collection Time: 01/23/19  4:52 AM  Result Value Ref Range   Opiates POSITIVE (A) NONE DETECTED   Cocaine NONE DETECTED NONE DETECTED   Benzodiazepines POSITIVE (A) NONE DETECTED   Amphetamines NONE DETECTED NONE DETECTED   Tetrahydrocannabinol POSITIVE (A) NONE DETECTED   Barbiturates NONE DETECTED NONE DETECTED  Comment: (NOTE) DRUG SCREEN FOR  MEDICAL PURPOSES ONLY.  IF CONFIRMATION IS NEEDED FOR ANY PURPOSE, NOTIFY LAB WITHIN 5 DAYS. LOWEST DETECTABLE LIMITS FOR URINE DRUG SCREEN Drug Class                     Cutoff (ng/mL) Amphetamine and metabolites    1000 Barbiturate and metabolites    200 Benzodiazepine                 200 Tricyclics and metabolites     300 Opiates and metabolites        300 Cocaine and metabolites        300 THC                            50 Performed at [facility], [address] [place], [place] [address]   Blood culture (routine x 2)     Status: None (Preliminary result)   Collection Time: 01/23/19  6:11 AM  Result Value Ref Range   Specimen Description BLOOD LEFT FOREARM    Special Requests      BOTTLES DRAWN AEROBIC AND ANAEROBIC Blood Culture adequate volume Performed at [facility], [address] [place], [place] [address]    Culture NO GROWTH < 12 HOURS    Report Status PENDING   Blood culture (routine x 2)     Status: None (Preliminary result)   Collection Time: 01/23/19  6:23 AM  Result Value Ref Range   Specimen Description BLOOD LEFT ANTECUBITAL    Special Requests      BOTTLES DRAWN AEROBIC AND ANAEROBIC Blood Culture adequate volume Performed at [facility], [address] [place], [place] [address]    Culture NO GROWTH < 12 HOURS    Report Status PENDING    Dg Chest Portable 1 View  Result Date: 01/23/2019 CLINICAL DATA:  Overdose EXAM: PORTABLE CHEST 1 VIEW COMPARISON:  12/04/2018 FINDINGS: Normal heart size and mediastinal contours. No acute infiltrate or edema. No effusion or pneumothorax. No acute osseous findings. IMPRESSION: Negative chest. Electronically Signed   By: Marnee SpringJonathon  Watts M.D.   On: 01/23/2019 04:57   EKG Interpretation  Date/Time:  Friday January 23 2019 03:24:25 EST Ventricular Rate:  102 PR Interval:    QRS Duration: 99 QT Interval:  354 QTC Calculation: 462 R Axis:   81 Text  Interpretation:  Sinus tachycardia Confirmed by Nicanor AlconPalumbo, April ([id number]) on 01/23/2019 3:30:17 AM Also confirmed by Nicanor AlconPalumbo, April ([id number]), editor Elita QuickWatlington, Beverly ([id number])  on 01/23/2019 11:21:55 AM   Pending Labs Unresulted Labs (From admission, onward)    Start     Ordered   Signed and Held  HIV antibody (Routine Testing)  Once,   R     Signed and Held   Signed and Held  Comprehensive metabolic panel  Tomorrow morning,   R     Signed and Held   Signed and Held  CBC  Tomorrow morning,   R     Signed and Held          Vitals/Pain Today's Vitals   01/23/19 1306 01/23/19 1401 01/23/19 1430 01/23/19 1441  BP: 114/77 (!) 127/95 (!) 119/92 (!) 119/92  Pulse: 88 85 64 90  Resp: 14 17 10 18   Temp:      TempSrc:      SpO2: 100% 100% 100% 100%  Weight:      Height:  PainSc:        Isolation Precautions No active isolations  Medications Medications  acetaminophen (TYLENOL) tablet 650 mg (has no administration in time range)    Or  acetaminophen (TYLENOL) suppository 650 mg (has no administration in time range)  ondansetron (ZOFRAN) tablet 4 mg (has no administration in time range)    Or  ondansetron (ZOFRAN) injection 4 mg (has no administration in time range)  vancomycin (VANCOCIN) 1,250 mg in sodium chloride 0.9 % 250 mL IVPB (has no administration in time range)  naloxone [facility]) injection 2 mg (2 mg Intravenous Given 01/23/19 0435)  sodium chloride 0.9 % bolus 1,000 mL (0 mLs Intravenous Stopped 01/23/19 0507)  naloxone (NARCAN) injection 2 mg (2 mg Intravenous Given 01/23/19 0541)  vancomycin (VANCOCIN) IVPB 1000 mg/200 mL premix (0 mg Intravenous Stopped 01/23/19 0756)  piperacillin-tazobactam (ZOSYN) IVPB 3.375 g (0 g Intravenous Stopped 01/23/19 0716)  Tdap (BOOSTRIX) injection 0.5 mL (0.5 mLs Intramuscular Given 01/23/19 0641)    Mobility walks

## 2019-01-23 NOTE — ED Notes (Signed)
Report given to Alwyn Ren RN for 5E, Room [phone].

## 2019-01-23 NOTE — H&P (Addendum)
[facility] History and Physical  Dalton RoysJulian Viereck UJW:[id number] DOB: February 28, 1996 DOA: 01/23/2019   PCP: Fredna DowSays that [place]. Specialists: None  Chief Complaint: Overdose  HPI: Dalton RoysJulian Johnson is a 23 y.o. male with a past medical history of hypertension diagnosed about 2 years ago and on clonidine, history of acid reflux disease, who was brought in by EMS after patient had overdosed on heroin.  He denies injecting heroin.  He snorted it.  Patient is still somewhat somnolent.  He easily arousable however.  History still remains limited.  He also mentioned to the ED provider that he uses Xanax occasionally to get high.  Also drinks beer on a regular basis.  There was some mention of him having suicidal ideation however patient denied suicidal ideation to multiple providers in the emergency department as well as to this author.  Patient denies any pain.  He is wondering when he can get out of the hospital.  Asking to drink water.  Regarding the rash in his hands he mentions that scaly type rash has been there for a very long period of time which he attributes to wearing gloves at work.  He works in [employer].  But then there is another area of redness especially over the dorsal aspect of his right hand which is new.  Patient denies any pain in either of his hands.  No joint pains.  Denies any fever or chills.  In the emergency department patient had to be given multiple doses of Narcan due to sedation.  Patient was started on antibiotics for the hand rash.  Home Medications: Prior to Admission medications   Medication Sig Start Date End Date Taking? Authorizing Provider  cloNIDine (CATAPRES) 0.1 MG tablet Take 0.1 mg by mouth 3 (three) times daily. 04/22/18  Yes [provider]  hydrOXYzine (ATARAX/VISTARIL) 25 MG tablet Take 1 tablet (25 mg total) by mouth every 6 (six) hours. 12/04/18  Yes Dayton ScrapeMurray, Alyssa B, PA-C  omeprazole (PRILOSEC) 40 MG capsule  Take 40 mg by mouth daily.   Yes [provider]    Allergies: No Known Allergies  Past Medical History: Past Medical History:  Diagnosis Date  . Anxiety   . GERD (gastroesophageal reflux disease)   . Hypertension     Past Surgical History:  Procedure Laterality Date  . HAND SURGERY    . TONSILLECTOMY      Social History: Patient admits to smoking cigarettes half to 1 pack of cigarettes per day.  Drinks 40 ounces of beer every day.  Heroin use once in a while.  Denies any cocaine use.  He occasionally uses marijuana.   Family History: Hypertension  Review of Systems - History obtained from the patient General ROS: positive for  - fatigue Psychological ROS: negative Ophthalmic ROS: negative ENT ROS: negative Allergy and Immunology ROS: negative Hematological and Lymphatic ROS: negative Endocrine ROS: negative Respiratory ROS: no cough, shortness of breath, or wheezing Cardiovascular ROS: no chest pain or dyspnea on exertion Gastrointestinal ROS: no abdominal pain, change in bowel habits, or black or bloody stools Genito-Urinary ROS: no dysuria, trouble voiding, or hematuria Musculoskeletal ROS: negative Neurological ROS: no TIA or stroke symptoms Dermatological ROS: as in hpi  Physical Examination  Vitals:   01/23/19 0530 01/23/19 0600 01/23/19 0630 01/23/19 0700  BP: 108/73 114/75 126/90 115/77  Pulse: 84 80 80 77  Resp: 20 (!) 21 16 18   Temp:      TempSrc:  SpO2: 98% 97% 97% 96%  Weight:      Height:        BP 115/77   Pulse 77   Temp 98.4 F (36.9 C) (Oral)   Resp 18   Ht 6\' 2"  (1.88 m)   Wt 79.4 kg   SpO2 96%   BMI 22.47 kg/m   General appearance: alert, cooperative, appears stated age and no distress Head: Normocephalic, without obvious abnormality, atraumatic Eyes: conjunctivae/corneas clear. PERRL, EOM's intact.  Throat: lips, mucosa, and tongue normal; teeth and gums normal Neck: no adenopathy, no carotid bruit, no JVD, supple,  symmetrical, trachea midline and thyroid not enlarged, symmetric, no tenderness/mass/nodules Resp: clear to auscultation bilaterally Cardio: regular rate and rhythm, S1, S2 normal, no murmur, click, rub or gallop GI: soft, non-tender; bowel sounds normal; no masses,  no organomegaly Extremities: No joint swelling.  Full range of motion of the joints of the hands and wrist. Pulses: 2+ and symmetric Skin: Patient has a scaly erythematous rash over the dorsal aspect of his hands bilaterally.  There is also another macular erythematous rash in the dorsal aspect of the right hand.  Somewhat blanchable.  Nontender.  Not really warm to touch.  Good peripheral pulses.  Scaly rash also noted over the left elbow area. Lymph nodes: Cervical, supraclavicular, and axillary nodes normal. Neurologic: Patient is somnolent but easily arousable.  Oriented x3.  Cranial nerves II to XII intact.  Motor strength equal bilateral upper and lower extremities.   Labs on Admission: I have personally reviewed following labs and imaging studies  CBC: Recent Labs  Lab 01/23/19 0337  WBC 19.4*  NEUTROABS 16.9*  HGB 16.1  HCT 49.5  MCV 92.9  PLT 244   Basic Metabolic Panel: Recent Labs  Lab 01/23/19 0337  NA 139  K 3.5  CL 103  CO2 21*  GLUCOSE 143*  BUN 11  CREATININE 1.34*  CALCIUM 9.3   GFR: Estimated Creatinine Clearance: 96.3 mL/min (A) (by C-G formula based on SCr of 1.34 mg/dL (H)). Liver Function Tests: Recent Labs  Lab 01/23/19 0337  AST 41  ALT 28  ALKPHOS 83  BILITOT 1.0  PROT 7.5  ALBUMIN 5.0   Cardiac Enzymes: Recent Labs  Lab 01/23/19 0337  CKTOTAL 336   CBG: Recent Labs  Lab 01/23/19 0329  GLUCAP 123*    Radiological Exams on Admission: Dg Chest Portable 1 View  Result Date: 01/23/2019 CLINICAL DATA:  Overdose EXAM: PORTABLE CHEST 1 VIEW COMPARISON:  12/04/2018 FINDINGS: Normal heart size and mediastinal contours. No acute infiltrate or edema. No effusion or  pneumothorax. No acute osseous findings. IMPRESSION: Negative chest. Electronically Signed   By: Marnee Spring M.D.   On: 01/23/2019 04:57    My interpretation of Electrocardiogram: Sinus tachycardia 102 bpm.  Normal axis.  Intervals are normal.  Some features concerning for LVH.  No concerning ST segment changes.   Problem List  Active Problems:   Heroin overdose (HCC)   Rash of hands   Essential hypertension   Assessment: This is a 24 year old Caucasian male with a past medical history that is remarkable for hypertension as well as acid reflux who comes in after heroin overdose.  Patient had to be given multiple doses of Narcan in the emergency department with good response.  He also has rash involving both his hands.  The scaly rash appears to be psoriasis.  Apparently his sister has psoriasis.  There is another area of erythema in the dorsal aspect of  the right hand which could be concerning for cellulitis.  Plan:  1. Heroin overdose: Patient denies IV drug use.  He snorts heroin.  He has received doses of Narcan due to oversedation.  Seems to be still sleepy but easily arousable.  Will need to continue to monitor him in a stepdown setting at least for the next several hours.  There was some mention of suicidal ideation but he has denied this to multiple providers in the ED.  We will still consult psych to see him.  2. Rash involving both hands: It appears that there are 2 types of rash involving his hands.  One is a scaly erythematous rash which could be consistent with psoriasis.  Another is more macular erythematous rash involving the dorsal aspect of the right hand.  He does not have any limitation of movement of any of the joints.  No tenderness.  No swelling of any of his joints.  WBC noted to be elevated.  Patient was given vancomycin and Zosyn in the ED.  We will continue with vancomycin for now.  He is afebrile.  Steroid ointment over the scaly rash.  Follow up on cultures.  3.   Essential hypertension: Apparently diagnosed 2 years ago.  On clonidine on a regular basis at home.  Patient mentions that he is compliant with this.  Unclear if he is ever been evaluated for secondary causes of hypertension.  This can be pursued in the outpatient setting.  For now his blood pressure is soft and so we will hold his clonidine.  Would resume as soon as his blood pressure starts trending upwards to avoid rebound hypertension.  4.  History of acid reflux: Continue Protonix.  5.  Mild acute renal failure: Creatinine noted to be 1.34 slightly higher than his baseline.  IV fluids.  Monitor urine output.  Recheck labs tomorrow.  ADDENDUM Patient seen by psychiatry.  Neurontin has been recommended for anxiety and alcohol use.  They do not feel that the patient is a danger to self.  No need for continued psychiatric care.  Patient reevaluated this afternoon.  His mentation is back to baseline.  He is sitting up in the chair.  Hand rash persists.  He is otherwise hemodynamically stable.  He can be downgraded from stepdown status to MedSurg bed for now.  Patient asking about going home.  He was advised that it would be prudent for him to get IV antibiotics for 24 hours and then potentially discharge tomorrow if his hand improves.  DVT Prophylaxis: Lovenox Code Status: Full code Family Communication: Discussed with the patient Disposition: Hopefully return home when medically stable Consults called: Psychiatry Admission Status: Observation  Severity of Illness: The appropriate patient status for this patient is OBSERVATION. Observation status is judged to be reasonable and necessary in order to provide the required intensity of service to ensure the patient's safety. The patient's presenting symptoms, physical exam findings, and initial radiographic and laboratory data in the context of their medical condition is felt to place them at decreased risk for further clinical deterioration.  Furthermore, it is anticipated that the patient will be medically stable for discharge from the hospital within 2 midnights of admission. The following factors support the patient status of observation.   " The patient's presenting symptoms include sedation, rash over his hand. " The physical exam findings include rash over his hand. " The initial radiographic and laboratory data are concerning for leukocytosis.   Further management decisions will depend on  results of further testing and patient's response to treatment.  Burton Gahan [employer]  [facility] [employer] on [email or web address]  01/23/2019, 7:35 AM

## 2019-01-23 NOTE — Discharge Summary (Signed)
[facility]  Physician Discharge Summary   Patient ID: Dalton Johnson MRN: [id number] DOB/AGE: [birth year] 23 y.o.  Admit date: 01/23/2019 Discharge date: 01/23/2019  PCP: Patient, No Pcp Per  PATIENT LEFT AGAINST MEDICAL ADVICE   INITIAL HISTORY: Dalton Johnson is a 23 y.o. male with a past medical history of hypertension diagnosed about 2 years ago and on clonidine, history of acid reflux disease, who was brought in by EMS after patient had overdosed on heroin.  He denies injecting heroin.  He snorted it.  Patient is still somewhat somnolent.  He easily arousable however.  History still remains limited.  He also mentioned to the ED provider that he uses Xanax occasionally to get high.  Also drinks beer on a regular basis.  There was some mention of him having suicidal ideation however patient denied suicidal ideation to multiple providers in the emergency department as well as to this author.  Patient denies any pain.  He is wondering when he can get out of the hospital.  Asking to drink water.  Regarding the rash in his hands he mentions that scaly type rash has been there for a very long period of time which he attributes to wearing gloves at work.  He works in [employer].  But then there is another area of redness especially over the dorsal aspect of his right hand which is new.  Patient denies any pain in either of his hands.  No joint pains.  Denies any fever or chills.  In the emergency department patient had to be given multiple doses of Narcan due to sedation.  Patient was started on antibiotics for the hand rash.  Consultations:  Psychiatry   HOSPITAL COURSE:   Patient had to be given doses of Narcan due to sedation.  He responded appropriately.  He was monitored.  He started improving.  Subsequently he was back to his baseline.  He was seen by psychiatry who did not feel like he was a risk to himself or others.  Regarding the rash in his hands, there was concern  for cellulitis superimposed on his chronic rash which could be psoriasis or some form of eczema.  He was placed on vancomycin.  Plan was to transition to oral antibiotics after 24 hours if he improves.   He was also found to have mild acute renal failure and leukocytosis.  He was given IV fluids.  Plan was to repeat these labs tomorrow morning.  Patient however decided to leave AGAINST MEDICAL ADVICE.  He was strongly advised not to do so as without treatment his cellulitis could get worse so which could significantly impair the function of his hand.    PERTINENT LABS:  The results of significant diagnostics from this hospitalization (including imaging, microbiology, ancillary and laboratory) are listed below for reference.    Microbiology: Recent Results (from the past 240 hour(s))  Blood culture (routine x 2)     Status: None (Preliminary result)   Collection Time: 01/23/19  6:11 AM  Result Value Ref Range Status   Specimen Description BLOOD LEFT FOREARM  Final   Special Requests   Final    BOTTLES DRAWN AEROBIC AND ANAEROBIC Blood Culture adequate volume Performed at [facility], [address] [place], [place] [address]    Culture NO GROWTH < 12 HOURS  Final   Report Status PENDING  Incomplete  Blood culture (routine x 2)     Status: None (Preliminary result)   Collection Time: 01/23/19  6:23  AM  Result Value Ref Range Status   Specimen Description BLOOD LEFT ANTECUBITAL  Final   Special Requests   Final    BOTTLES DRAWN AEROBIC AND ANAEROBIC Blood Culture adequate volume Performed at [facility], [address] [place], [place] [address]    Culture NO GROWTH < 12 HOURS  Final   Report Status PENDING  Incomplete     Labs: Basic Metabolic Panel: Recent Labs  Lab 01/23/19 0337  NA 139  K 3.5  CL 103  CO2 21*  GLUCOSE 143*  BUN 11  CREATININE 1.34*  CALCIUM 9.3   Liver Function Tests: Recent Labs  Lab 01/23/19 0337  AST  41  ALT 28  ALKPHOS 83  BILITOT 1.0  PROT 7.5  ALBUMIN 5.0   CBC: Recent Labs  Lab 01/23/19 0337  WBC 19.4*  NEUTROABS 16.9*  HGB 16.1  HCT 49.5  MCV 92.9  PLT 244   Cardiac Enzymes: Recent Labs  Lab 01/23/19 0337  CKTOTAL 336   CBG: Recent Labs  Lab 01/23/19 0329  GLUCAP 123*     IMAGING STUDIES Dg Chest Portable 1 View  Result Date: 01/23/2019 CLINICAL DATA:  Overdose EXAM: PORTABLE CHEST 1 VIEW COMPARISON:  12/04/2018 FINDINGS: Normal heart size and mediastinal contours. No acute infiltrate or edema. No effusion or pneumothorax. No acute osseous findings. IMPRESSION: Negative chest. Electronically Signed   By: Marnee Spring M.D.   On: 01/23/2019 04:57   PATIENT LEFT AGAINST MEDICAL ADVICE  Osvaldo Shipper  [facility] Pager on [email or web address]  01/23/2019, 6:48 PM

## 2019-01-23 NOTE — Consult Note (Signed)
[facility] Face-to-Face Psychiatry Consult   Reason for Consult:  Drug overdose  Referring Physician:  Dr. Rito Ehrlich  Patient Identification: Dalton Johnson MRN:  [id number] Principal Diagnosis: Polysubstance abuse [facility]) Diagnosis:  Principal Problem:   Polysubstance abuse (HCC) Active Problems:   Heroin overdose (HCC)   Rash of hands   Essential hypertension   Total Time spent with patient: 1 hour  Subjective:   Dalton Johnson is a 23 y.o. male patient admitted with heroin overdose.  HPI:  Per chart review, patient was admitted with heroin overdose. He denies SI and does not remember making suicidal statements. He denies a prior history of suicide attempts. He minimizes his substance use. He reports snorting heroin once a month. He reports that he is from [facility] and the "drugs out here will kill you." He reports occasional Xanax use from off the streets for anxiety. He takes up to a "bite off a bar." He reports rare marijuana use. He reports heavy alcohol use 1-2 times a week. He completed rehab a year ago for alcohol use. He was placed on Seroquel for sleep while in rehab. He denies a history of manic symptoms (decreased need for sleep, increased energy, pressured speech or euphoria). He lived in a halfway house for 3 years. He denies current mood symptoms. He denies HI or AVH. He reports fair sleep and denies problems with his appetite.   Of note, patient's fiance was contacted by phone with his verbal consent. She denies concerns for his safety. She reports that they did have an argument yesterday but he does not want to harm himself. She reports that they have resolved their issues. He does not have access to guns or weapons.   Past Psychiatric History: Polysubstance abuse (opiates, benzodiazepines, marijuana and alcohol)  Risk to Self:  None. Denies SI.  Risk to Others:  None. Denies HI.  Prior Inpatient Therapy:  He completed rehab for alcohol abuse a year ago.  Prior Outpatient  Therapy:  Denies   Past Medical History:  Past Medical History:  Diagnosis Date  . Anxiety   . GERD (gastroesophageal reflux disease)   . Hypertension     Past Surgical History:  Procedure Laterality Date  . HAND SURGERY    . TONSILLECTOMY     Family History: No family history on file. Family Psychiatric  History: Maternal aunt-schizoaffective disorder  Social History:  Social History   Substance and Sexual Activity  Alcohol Use Yes  . Alcohol/week: 5.0 standard drinks  . Types: 5 Cans of beer per week     Social History   Substance and Sexual Activity  Drug Use Not Currently    Social History   Socioeconomic History  . Marital status: Single    Spouse name: Not on file  . Number of children: Not on file  . Years of education: Not on file  . Highest education level: Not on file  Occupational History  . Not on file  Social Needs  . Financial resource strain: Not on file  . Food insecurity:    Worry: Not on file    Inability: Not on file  . Transportation needs:    Medical: Not on file    Non-medical: Not on file  Tobacco Use  . Smoking status: Current Every Day Smoker    Packs/day: 1.00    Years: 5.00    Pack years: 5.00    Types: Cigarettes  . Smokeless tobacco: Never Used  Substance and Sexual Activity  . Alcohol  use: Yes    Alcohol/week: 5.0 standard drinks    Types: 5 Cans of beer per week  . Drug use: Not Currently  . Sexual activity: Yes    Birth control/protection: None  Lifestyle  . Physical activity:    Days per week: Not on file    Minutes per session: Not on file  . Stress: Not on file  Relationships  . Social connections:    Talks on phone: Not on file    Gets together: Not on file    Attends religious service: Not on file    Active member of club or organization: Not on file    Attends meetings of clubs or organizations: Not on file    Relationship status: Not on file  Other Topics Concern  . Not on file  Social History Narrative   . Not on file   Additional Social History: He lives in a hotel with his fiance of 2 years. 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     Allergies:  No Known Allergies  Labs:  Results for orders placed or performed during the hospital encounter of 01/23/19 (from the past 48 hour(s))  CBG monitoring, ED     Status: Abnormal   Collection Time: 01/23/19  3:29 AM  Result Value Ref Range   Glucose-Capillary 123 (H) 70 - 99 mg/dL  Comprehensive metabolic panel     Status: Abnormal   Collection Time: 01/23/19  3:37 AM  Result Value Ref Range   Sodium 139 135 - 145 mmol/L   Potassium 3.5 3.5 - 5.1 mmol/L   Chloride 103 98 - 111 mmol/L   CO2 21 (L) 22 - 32 mmol/L   Glucose, Bld 143 (H) 70 - 99 mg/dL   BUN 11 6 - 20 mg/dL   Creatinine, Ser 1.61 (H) 0.61 - 1.24 mg/dL   Calcium 9.3 8.9 - 09.6 mg/dL   Total Protein 7.5 6.5 - 8.1 g/dL   Albumin 5.0 3.5 - 5.0 g/dL   AST 41 15 - 41 U/L   ALT 28 0 - 44 U/L   Alkaline Phosphatase 83 38 - 126 U/L   Total Bilirubin 1.0 0.3 - 1.2 mg/dL   GFR calc non Af Amer >60 >60 mL/min   GFR calc Af Amer >60 >60 mL/min   Anion gap 15 5 - 15    Comment: Performed at [facility], [address], [place], [place] [address]  Salicylate level     Status: None   Collection Time: 01/23/19  3:37 AM  Result Value Ref Range   Salicylate Lvl <7.0 2.8 - 30.0 mg/dL    Comment: Performed at [facility], [address] [place], [place] [address]  Acetaminophen level     Status: Abnormal   Collection Time: 01/23/19  3:37 AM  Result Value Ref Range   Acetaminophen (Tylenol), Serum <10 (L) 10 - 30 ug/mL    Comment: (NOTE) Therapeutic concentrations vary significantly. A range of 10-30 ug/mL  may be an effective concentration for many patients. However, some  are best treated at concentrations outside of this range. Acetaminophen concentrations >150 ug/mL at 4 hours after ingestion  and >50 ug/mL at 12 hours after ingestion are often  associated with  toxic reactions. Performed at [facility], [address] [place], [place] [address]   Ethanol     Status: None   Collection Time: 01/23/19  3:37 AM  Result Value Ref Range   Alcohol,  Ethyl (B) <10 <10 mg/dL    Comment: (NOTE) Lowest detectable limit for serum alcohol is 10 mg/dL. For medical purposes only. Performed at [facility], [address], [place], [place] [address]   CBC WITH DIFFERENTIAL     Status: Abnormal   Collection Time: 01/23/19  3:37 AM  Result Value Ref Range   WBC 19.4 (H) 4.0 - 10.5 K/uL   RBC 5.33 4.22 - 5.81 MIL/uL   Hemoglobin 16.1 13.0 - 17.0 g/dL   HCT 34.7 42.5 - 95.6 %   MCV 92.9 80.0 - 100.0 fL   MCH 30.2 26.0 - 34.0 pg   MCHC 32.5 30.0 - 36.0 g/dL   RDW 38.7 56.4 - 33.2 %   Platelets 244 150 - 400 K/uL   nRBC 0.0 0.0 - 0.2 %   Neutrophils Relative % 88 %   Neutro Abs 16.9 (H) 1.7 - 7.7 K/uL   Lymphocytes Relative 6 %   Lymphs Abs 1.2 0.7 - 4.0 K/uL   Monocytes Relative 6 %   Monocytes Absolute 1.2 (H) 0.1 - 1.0 K/uL   Eosinophils Relative 0 %   Eosinophils Absolute 0.0 0.0 - 0.5 K/uL   Basophils Relative 0 %   Basophils Absolute 0.1 0.0 - 0.1 K/uL   Immature Granulocytes 0 %   Abs Immature Granulocytes 0.07 0.00 - 0.07 K/uL    Comment: Performed at [facility], [address] [place], [place] [address]  CK     Status: None   Collection Time: 01/23/19  3:37 AM  Result Value Ref Range   Total CK 336 49 - 397 U/L    Comment: Performed at [facility], [address] [place], [place] [address]  Urinalysis, Routine w reflex microscopic     Status: Abnormal   Collection Time: 01/23/19  4:49 AM  Result Value Ref Range   Color, Urine YELLOW YELLOW   APPearance CLEAR CLEAR   Specific Gravity, Urine 1.009 1.005 - 1.030   pH 5.0 5.0 - 8.0   Glucose, UA NEGATIVE NEGATIVE mg/dL   Hgb urine dipstick SMALL (A) NEGATIVE   Bilirubin Urine NEGATIVE  NEGATIVE   Ketones, ur 5 (A) NEGATIVE mg/dL   Protein, ur 30 (A) NEGATIVE mg/dL   Nitrite NEGATIVE NEGATIVE   Leukocytes,Ua NEGATIVE NEGATIVE   RBC / HPF 0-5 0 - 5 RBC/hpf   WBC, UA 0-5 0 - 5 WBC/hpf   Bacteria, UA RARE (A) NONE SEEN   Squamous Epithelial / LPF 0-5 0 - 5   Mucus PRESENT    Hyaline Casts, UA PRESENT     Comment: Performed at [facility], [address], [place], [place] [address]  Urine rapid drug screen (hosp performed)     Status: Abnormal   Collection Time: 01/23/19  4:52 AM  Result Value Ref Range   Opiates POSITIVE (A) NONE DETECTED   Cocaine NONE DETECTED NONE DETECTED   Benzodiazepines POSITIVE (A) NONE DETECTED   Amphetamines NONE DETECTED NONE DETECTED   Tetrahydrocannabinol POSITIVE (A) NONE DETECTED   Barbiturates NONE DETECTED NONE DETECTED    Comment: (NOTE) DRUG SCREEN FOR MEDICAL PURPOSES ONLY.  IF CONFIRMATION IS NEEDED FOR ANY PURPOSE, NOTIFY LAB WITHIN 5 DAYS. LOWEST DETECTABLE LIMITS FOR URINE DRUG SCREEN Drug Class                     Cutoff (ng/mL) Amphetamine and metabolites    1000 Barbiturate and metabolites  200 Benzodiazepine                 200 Tricyclics and metabolites     300 Opiates and metabolites        300 Cocaine and metabolites        300 THC                            50 Performed at [facility], [address] [place], [place] [address]   Blood culture (routine x 2)     Status: None (Preliminary result)   Collection Time: 01/23/19  6:11 AM  Result Value Ref Range   Specimen Description BLOOD LEFT FOREARM    Special Requests      BOTTLES DRAWN AEROBIC AND ANAEROBIC Blood Culture adequate volume Performed at [facility], [address] [place], [place] [address]    Culture PENDING    Report Status PENDING     Current Facility-Administered Medications  Medication Dose Route Frequency Provider Last Rate Last Dose  . acetaminophen (TYLENOL) tablet 650  mg  650 mg Oral Q6H PRN Osvaldo ShipperKrishnan, Gokul, MD       Or  . acetaminophen (TYLENOL) suppository 650 mg  650 mg Rectal Q6H PRN Osvaldo ShipperKrishnan, Gokul, MD      . ondansetron [facility](ZOFRAN) tablet 4 mg  4 mg Oral Q6H PRN Osvaldo ShipperKrishnan, Gokul, MD       Or  . ondansetron [facility](ZOFRAN) injection 4 mg  4 mg Intravenous Q6H PRN Osvaldo ShipperKrishnan, Gokul, MD      . vancomycin (VANCOCIN) 1,250 mg in sodium chloride 0.9 % 250 mL IVPB  1,250 mg Intravenous Q12H Maurice MarchJackson, Rachel E, [facility]       Current Outpatient Medications  Medication Sig Dispense Refill  . cloNIDine (CATAPRES) 0.1 MG tablet Take 0.1 mg by mouth 3 (three) times daily.  1  . hydrOXYzine (ATARAX/VISTARIL) 25 MG tablet Take 1 tablet (25 mg total) by mouth every 6 (six) hours. 12 tablet 0  . omeprazole (PRILOSEC) 40 MG capsule Take 40 mg by mouth daily.      Musculoskeletal: Strength & Muscle Tone: within normal limits Gait & Station: UTA since patient is lying in bed. Patient leans: N/A  Psychiatric Specialty Exam: Physical Exam  Nursing note and vitals reviewed. Constitutional: He is oriented to person, place, and time. He appears well-developed and well-nourished.  HENT:  Head: Normocephalic and atraumatic.  Neck: Normal range of motion.  Respiratory: Effort normal.  Musculoskeletal: Normal range of motion.  Neurological: He is alert and oriented to person, place, and time.  Psychiatric: He has a normal mood and affect. His speech is normal and behavior is normal. Judgment and thought content normal. Cognition and memory are normal.    Review of Systems  Psychiatric/Behavioral: Positive for substance abuse. Negative for depression, hallucinations and suicidal ideas. The patient is nervous/anxious.   All other systems reviewed and are negative.   Blood pressure 116/69, pulse 63, temperature 98.4 F (36.9 C), temperature source Oral, resp. rate 13, height 6\' 2"  (1.88 m), weight 79.4 kg, SpO2 100 %.Body mass index is 22.47 kg/m.  General Appearance: Fairly Groomed,  young, Caucasian male, wearing a hospital gown with facial piercings who is lying in bed. NAD.  Eye Contact:  Good  Speech:  Clear and Coherent and Normal Rate  Volume:  Normal  Mood:  Euthymic  Affect:  Appropriate and Congruent  Thought Process:  Goal Directed, Linear  and Descriptions of Associations: Intact  Orientation:  Full (Time, Place, and Person)  Thought Content:  Logical  Suicidal Thoughts:  No  Homicidal Thoughts:  No  Memory:  Immediate;   Good Recent;   Good Remote;   Good  Judgement:  Fair  Insight:  Fair  Psychomotor Activity:  Normal  Concentration:  Concentration: Good and Attention Span: Good  Recall:  Good  Fund of Knowledge:  Good  Language:  Good  Akathisia:  No  Handed:  Right  AIMS (if indicated):   N/A  Assets:  Architect Housing Intimacy Physical Health Resilience Social Support  ADL's:  Intact  Cognition:  WNL  Sleep:   Fair   Assessment:  Dalton Johnson is a 23 y.o. male who was admitted with an accidental heroin overdose. He denies SI, HI or AVH. He denies a history of suicide attempts. He does endorse self-medicating with Xanax to treat anxiety. He minimizes his substance use and declines substance abuse treatment resources. Recommend Gabapentin for anxiety and alcohol use. Please have SW provide patient with outpatient mental health resources. Collateral was obtained from his fiance and she does not have safety concerns.   Treatment Plan Summary: -Start Gabapentin 300 mg TID for alcohol use/anxiety. -EKG reviewed and QTc 462. Please closely monitor when starting or increasing QTc prolonging agents.  -Please have SW provide patient with outpatient mental health resources.  -Psychiatry will sign off on patient at this time. Please consult psychiatry again as needed.   Disposition: No evidence of imminent risk to self or others at present.   Patient does not meet criteria for psychiatric inpatient  admission.  Cherly Beach, DO 01/23/2019 10:43 AM

## 2019-01-23 NOTE — ED Triage Notes (Signed)
Pt arriving via EMS for heroin overdose. Pt admits to snorting heroin today denies taking any other drugs. Pt denies SI/HI. 2mg  Narcan intranasal given by [employer].

## 2019-01-23 NOTE — ED Notes (Signed)
Pt denies SI at this time. 

## 2019-01-23 NOTE — BHH Counselor (Signed)
Clinician attempted to assess however but continued sleeping and is unable to answer questions. TTS assessment to be completed once pt is alert, roused and able to engage. Discussed with Dr. Nicanor Alcon and Leta Jungling, Charge Nurse.    Redmond Pulling, MS, [facility], [facility] Triage Specialist [phone]

## 2019-01-23 NOTE — ED Notes (Signed)
Bed: UD14 Expected date:  Expected time:  Means of arrival:  Comments: 69M OD/SI

## 2019-01-23 NOTE — ED Provider Notes (Signed)
[facility]-EMERGENCY DEPT Provider Note   CSN: [id number] Arrival date & time: 01/23/19  3875    History   Chief Complaint Chief Complaint  Patient presents with  . Drug Overdose    HPI Dalton Johnson is a 23 y.o. male.     The history is provided by the patient and the EMS personnel. The history is limited by the condition of the patient.  Drug Overdose  This is a new problem. Episode onset: unknown. The problem occurs constantly. The problem has been gradually improving. Pertinent negatives include no chest pain, no abdominal pain, no headaches and no shortness of breath. Nothing aggravates the symptoms. Nothing relieves the symptoms. Treatments tried: narcan and bagging. The treatment provided significant relief.  Patient with a h/o polysubstance abuse presents post overdose.  EMS reports patient may have made statements to girlfriend about SI.  Patient is awake at this time and denies SI or that he made these statements.  Denies SI or HI.  States he used heroin and xanax to get high.  States hand is red because of the gloves he has to wear at work.  Denies f/c/r.    Past Medical History:  Diagnosis Date  . Anxiety   . GERD (gastroesophageal reflux disease)   . Hypertension     There are no active problems to display for this patient.   Past Surgical History:  Procedure Laterality Date  . HAND SURGERY    . TONSILLECTOMY          Home Medications    Prior to Admission medications   Medication Sig Start Date End Date Taking? Authorizing Provider  cloNIDine (CATAPRES) 0.1 MG tablet Take 0.1 mg by mouth 3 (three) times daily. 04/22/18  Yes [provider]  hydrOXYzine (ATARAX/VISTARIL) 25 MG tablet Take 1 tablet (25 mg total) by mouth every 6 (six) hours. 12/04/18  Yes [place] Scrape, Alyssa B, PA-C  omeprazole (PRILOSEC) 40 MG capsule Take 40 mg by mouth daily.   Yes [provider]    Family History No family history on  file.  Social History Social History   Tobacco Use  . Smoking status: Current Every Day Smoker    Packs/day: 1.00    Years: 5.00    Pack years: 5.00    Types: Cigarettes  . Smokeless tobacco: Never Used  Substance Use Topics  . Alcohol use: Yes    Alcohol/week: 5.0 standard drinks    Types: 5 Cans of beer per week  . Drug use: Not Currently     Allergies   Patient has no known allergies.   Review of Systems Review of Systems  Constitutional: Negative for fever.  HENT: Negative for sore throat.   Eyes: Negative for photophobia.  Respiratory: Negative for cough and shortness of breath.   Cardiovascular: Negative for chest pain.  Gastrointestinal: Negative for abdominal pain, nausea and vomiting.  Musculoskeletal: Negative for back pain, neck pain and neck stiffness.  Neurological: Negative for weakness, numbness and headaches.  Psychiatric/Behavioral: Negative for decreased concentration, dysphoric mood, hallucinations, self-injury and suicidal ideas.  All other systems reviewed and are negative.    Physical Exam Updated Vital Signs BP (!) 150/95   Pulse (!) 112   Temp 98.4 F (36.9 C) (Oral)   Resp 15   Ht 6\' 2"  (1.88 m)   Wt 79.4 kg   SpO2 99%   BMI 22.47 kg/m   Physical Exam Constitutional:      General: He is not in  acute distress.    Appearance: He is normal weight.  HENT:     Head: Normocephalic and atraumatic.     Nose: Nose normal.     Mouth/Throat:     Pharynx: Oropharynx is clear.  Eyes:     Extraocular Movements: Extraocular movements intact.     Conjunctiva/sclera: Conjunctivae normal.     Pupils: Pupils are equal, round, and reactive to light.  Neck:     Musculoskeletal: Normal range of motion and neck supple.  Cardiovascular:     Rate and Rhythm: Normal rate and regular rhythm.     Pulses: Normal pulses.     Heart sounds: Normal heart sounds.  Pulmonary:     Effort: Pulmonary effort is normal.     Breath sounds: Normal breath  sounds.  Abdominal:     General: Abdomen is flat.     Tenderness: There is no abdominal tenderness. There is no guarding or rebound.  Musculoskeletal: Normal range of motion.  Skin:    General: Skin is warm.     Capillary Refill: Capillary refill takes less than 2 seconds.     Findings: Erythema present.       Neurological:     Mental Status: He is alert and oriented to person, place, and time.  Psychiatric:        Mood and Affect: Mood normal.        Behavior: Behavior normal.        Thought Content: Thought content does not include suicidal ideation. Thought content does not include suicidal plan.      ED Treatments / Results  Labs (all labs ordered are listed, but only abnormal results are displayed) Results for orders placed or performed during the hospital encounter of 01/23/19  Comprehensive metabolic panel  Result Value Ref Range   Sodium 139 135 - 145 mmol/L   Potassium 3.5 3.5 - 5.1 mmol/L   Chloride 103 98 - 111 mmol/L   CO2 21 (L) 22 - 32 mmol/L   Glucose, Bld 143 (H) 70 - 99 mg/dL   BUN 11 6 - 20 mg/dL   Creatinine, Ser 2.42 (H) 0.61 - 1.24 mg/dL   Calcium 9.3 8.9 - 35.3 mg/dL   Total Protein 7.5 6.5 - 8.1 g/dL   Albumin 5.0 3.5 - 5.0 g/dL   AST 41 15 - 41 U/L   ALT 28 0 - 44 U/L   Alkaline Phosphatase 83 38 - 126 U/L   Total Bilirubin 1.0 0.3 - 1.2 mg/dL   GFR calc non Af Amer >60 >60 mL/min   GFR calc Af Amer >60 >60 mL/min   Anion gap 15 5 - 15  Salicylate level  Result Value Ref Range   Salicylate Lvl <7.0 2.8 - 30.0 mg/dL  Acetaminophen level  Result Value Ref Range   Acetaminophen (Tylenol), Serum <10 (L) 10 - 30 ug/mL  Ethanol  Result Value Ref Range   Alcohol, Ethyl (B) <10 <10 mg/dL  CBC WITH DIFFERENTIAL  Result Value Ref Range   WBC 19.4 (H) 4.0 - 10.5 K/uL   RBC 5.33 4.22 - 5.81 MIL/uL   Hemoglobin 16.1 13.0 - 17.0 g/dL   HCT 61.4 43.1 - 54.0 %   MCV 92.9 80.0 - 100.0 fL   MCH 30.2 26.0 - 34.0 pg   MCHC 32.5 30.0 - 36.0 g/dL   RDW  08.6 76.1 - 95.0 %   Platelets 244 150 - 400 K/uL   nRBC 0.0 0.0 - 0.2 %  Neutrophils Relative % 88 %   Neutro Abs 16.9 (H) 1.7 - 7.7 K/uL   Lymphocytes Relative 6 %   Lymphs Abs 1.2 0.7 - 4.0 K/uL   Monocytes Relative 6 %   Monocytes Absolute 1.2 (H) 0.1 - 1.0 K/uL   Eosinophils Relative 0 %   Eosinophils Absolute 0.0 0.0 - 0.5 K/uL   Basophils Relative 0 %   Basophils Absolute 0.1 0.0 - 0.1 K/uL   Immature Granulocytes 0 %   Abs Immature Granulocytes 0.07 0.00 - 0.07 K/uL  CBG monitoring, ED  Result Value Ref Range   Glucose-Capillary 123 (H) 70 - 99 mg/dL   No results found.  EKG EKG Interpretation  Date/Time:  Friday January 23 2019 03:24:25 EST Ventricular Rate:  102 PR Interval:    QRS Duration: 99 QT Interval:  354 QTC Calculation: 462 R Axis:   81 Text Interpretation:  Sinus tachycardia Confirmed by Marijo Quizon ([id number]) on 01/23/2019 3:30:17 AM   Radiology No results found.  Procedures Procedures (including critical care time)  Medications Ordered in ED Medications  vancomycin (VANCOCIN) IVPB 1000 mg/200 mL premix (has no administration in time range)  piperacillin-tazobactam (ZOSYN) IVPB 3.375 g (has no administration in time range)  Tdap (BOOSTRIX) injection 0.5 mL (has no administration in time range)  naloxone [facility](NARCAN) injection 2 mg (2 mg Intravenous Given 01/23/19 0435)  sodium chloride 0.9 % bolus 1,000 mL (0 mLs Intravenous Stopped 01/23/19 0507)  naloxone (NARCAN) injection 2 mg (2 mg Intravenous Given 01/23/19 0541)    TTS attempted to see the patient but stated he was too sleepy.  Patient falling back to sleep and was given narcan x 1.    Final Clinical Impressions(s) / ED Diagnoses  White count likely secondary to cellulitis.  While patient is denying SI will need clearance when more sober.      Ailea Rhatigan, MD 01/23/19 [phone]

## 2019-01-28 LAB — CULTURE, BLOOD (ROUTINE X 2)
Culture: NO GROWTH
Culture: NO GROWTH
Special Requests: ADEQUATE
Special Requests: ADEQUATE

## 2019-03-12 ENCOUNTER — Telehealth: Payer: Self-pay | Admitting: *Deleted

## 2019-03-12 NOTE — Telephone Encounter (Signed)
Left the pt a message to call myself or Mindy CMA back to further discuss his upcoming virtual visit with Dr Delton See on 4/23, and arrange video vs telephone visit, and obtain consent to treat thereafter.

## 2019-03-26 ENCOUNTER — Telehealth (INDEPENDENT_AMBULATORY_CARE_PROVIDER_SITE_OTHER): Payer: Self-pay | Admitting: Cardiology

## 2019-03-26 ENCOUNTER — Other Ambulatory Visit: Payer: Self-pay

## 2019-03-26 DIAGNOSIS — I10 Essential (primary) hypertension: Secondary | ICD-10-CM

## 2019-03-26 NOTE — Progress Notes (Signed)
The patient didn't answer the phone after multiple attempts 

## 2019-04-18 ENCOUNTER — Other Ambulatory Visit: Payer: Self-pay

## 2019-04-18 DIAGNOSIS — Z79899 Other long term (current) drug therapy: Secondary | ICD-10-CM | POA: Insufficient documentation

## 2019-04-18 DIAGNOSIS — Y9289 Other specified places as the place of occurrence of the external cause: Secondary | ICD-10-CM | POA: Insufficient documentation

## 2019-04-18 DIAGNOSIS — Y939 Activity, unspecified: Secondary | ICD-10-CM | POA: Insufficient documentation

## 2019-04-18 DIAGNOSIS — M779 Enthesopathy, unspecified: Secondary | ICD-10-CM | POA: Insufficient documentation

## 2019-04-18 DIAGNOSIS — S61012A Laceration without foreign body of left thumb without damage to nail, initial encounter: Secondary | ICD-10-CM | POA: Insufficient documentation

## 2019-04-18 DIAGNOSIS — F17210 Nicotine dependence, cigarettes, uncomplicated: Secondary | ICD-10-CM | POA: Insufficient documentation

## 2019-04-18 DIAGNOSIS — Y990 Civilian activity done for income or pay: Secondary | ICD-10-CM | POA: Insufficient documentation

## 2019-04-18 DIAGNOSIS — I10 Essential (primary) hypertension: Secondary | ICD-10-CM | POA: Insufficient documentation

## 2019-04-18 DIAGNOSIS — W260XXA Contact with knife, initial encounter: Secondary | ICD-10-CM | POA: Insufficient documentation

## 2019-04-19 ENCOUNTER — Encounter (HOSPITAL_COMMUNITY): Payer: Self-pay | Admitting: Emergency Medicine

## 2019-04-19 ENCOUNTER — Other Ambulatory Visit: Payer: Self-pay

## 2019-04-19 ENCOUNTER — Emergency Department (HOSPITAL_COMMUNITY)
Admission: EM | Admit: 2019-04-19 | Discharge: 2019-04-19 | Disposition: A | Discharge status: Home / Self Care | Payer: Self-pay | Attending: Emergency Medicine | Admitting: Emergency Medicine

## 2019-04-19 DIAGNOSIS — M778 Other enthesopathies, not elsewhere classified: Secondary | ICD-10-CM

## 2019-04-19 DIAGNOSIS — S61012A Laceration without foreign body of left thumb without damage to nail, initial encounter: Secondary | ICD-10-CM

## 2019-04-19 MED ORDER — CEPHALEXIN 500 MG PO CAPS
500.0000 mg | ORAL_CAPSULE | Freq: Once | ORAL | Status: AC
  Administered 2019-04-19: 500 mg via ORAL
  Filled 2019-04-19: qty 1

## 2019-04-19 MED ORDER — CEPHALEXIN 500 MG PO CAPS: 500.0000 mg | ORAL_CAPSULE | Freq: Four times a day (QID) | ORAL | 0 refills | Status: DC

## 2019-04-19 NOTE — ED Notes (Signed)
Pt resting. VSS. Meds given per [facility] and dermabond applied to cut on finger. Pt tolerated well. Awaiting discharge.

## 2019-04-19 NOTE — ED Triage Notes (Signed)
Patient was at work and sliced his thumb on the left side. Patient also complaining about arm hurting on the right side. He states he does not know what happened to the right arm.

## 2019-04-19 NOTE — ED Provider Notes (Signed)
[facility]-EMERGENCY DEPT Provider Note   CSN: [id number] Arrival date & time: 04/18/19  2358    History   Chief Complaint Chief Complaint  Patient presents with  . Extremity Laceration    HPI Valerie RoysJulian Roebuck is a 23 y.o. male.     23 year old male presents to the emergency department for evaluation of left thumb laceration.  This was sustained at work around [employer] tonight.  Pressure was applied at work with control of the bleeding.  He reports worsening, throbbing pain through the night prompting his ED evaluation.  His tetanus is up-to-date.  No medications taken prior to arrival.  He is also complaining of right wrist pain and swelling.  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  He has not taken any medication for his pain, but it is aggravated with wrist rotation.  No numbness or weakness.  The history is provided by the patient. No language interpreter was used.    Past Medical History:  Diagnosis Date  . Anxiety   . GERD (gastroesophageal reflux disease)   . Hypertension     Patient Active Problem List   Diagnosis Date Noted  . Heroin overdose (HCC) 01/23/2019  . Rash of hands 01/23/2019  . Essential hypertension 01/23/2019  . Cellulitis 01/23/2019  . Polysubstance abuse [facility](HCC)     Past Surgical History:  Procedure Laterality Date  . HAND SURGERY    . TONSILLECTOMY          Home Medications    Prior to Admission medications   Medication Sig Start Date End Date Taking? Authorizing Provider  cephALEXin (KEFLEX) 500 MG capsule Take 1 capsule (500 mg total) by mouth 4 (four) times daily. 04/19/19   Antony MaduraHumes, Katty Fretwell, PA-C  cloNIDine (CATAPRES) 0.1 MG tablet Take 0.1 mg by mouth 3 (three) times daily. 04/22/18   [provider]  hydrOXYzine (ATARAX/VISTARIL) 25 MG tablet Take 1 tablet (25 mg total) by mouth every 6 (six) hours. 12/04/18   Aviva KluverMurray, Alyssa B, PA-C  omeprazole (PRILOSEC) 40 MG capsule Take 40 mg by mouth  daily.    [provider]    Family History History reviewed. No pertinent family history.  Social History Social History   Tobacco Use  . Smoking status: Current Every Day Smoker    Packs/day: 1.00    Years: 5.00    Pack years: 5.00    Types: Cigarettes  . Smokeless tobacco: Never Used  Substance Use Topics  . Alcohol use: Yes    Alcohol/week: 5.0 standard drinks    Types: 5 Cans of beer per week  . Drug use: Not Currently     Allergies   Patient has no known allergies.   Review of Systems Review of Systems Ten systems reviewed and are negative for acute change, except as noted in the HPI.    Physical Exam Updated Vital Signs BP 120/80 (BP Location: Left Arm)   Pulse 87   Temp 99 F (37.2 C) (Oral)   Resp 17   Ht 6\' 1"  (1.854 m)   Wt 77.1 kg   SpO2 98%   BMI 22.43 kg/m   Physical Exam Vitals signs and nursing note reviewed.  Constitutional:      General: He is not in acute distress.    Appearance: He is well-developed. He is not diaphoretic.     Comments: Nontoxic appearing and in NAD  HENT:     Head: Normocephalic and atraumatic.  Eyes:  General: No scleral icterus.    Conjunctiva/sclera: Conjunctivae normal.  Neck:     Musculoskeletal: Normal range of motion.  Cardiovascular:     Rate and Rhythm: Normal rate and regular rhythm.     Pulses: Normal pulses.     Comments: Capillary refill brisk in all digits of the left hand Pulmonary:     Effort: Pulmonary effort is normal. No respiratory distress.     Comments: Respirations even and unlabored Musculoskeletal: Normal range of motion.  Skin:    General: Skin is warm and dry.     Coloration: Skin is not pale.     Findings: No erythema or rash.     Comments: Avulsion to the L thumb. Bleeding controlled. No wound dehiscence. No erythema, purulent drainage, heat to touch.  Neurological:     Mental Status: He is alert and oriented to person, place, and time.  Psychiatric:         Behavior: Behavior normal.      ED Treatments / Results  Labs (all labs ordered are listed, but only abnormal results are displayed) Labs Reviewed - No data to display  EKG None  Radiology No results found.  Procedures Procedures (including critical care time)  LACERATION REPAIR Performed by: Antony Madura Authorized by: Antony Madura Consent: Verbal consent obtained. Risks and benefits: risks, benefits and alternatives were discussed Consent given by: patient Patient identity confirmed: provided demographic data Prepped and Draped in normal sterile fashion Wound explored  Laceration Location: L thumb  Laceration Length: 2cm  No Foreign Bodies seen or palpated  Amount of cleaning: standard  Skin closure: dermabond  Number of sutures: n/a  Technique: simple  Patient tolerance: Patient tolerated the procedure well with no immediate complications.   Medications Ordered in ED Medications  cephALEXin (KEFLEX) capsule 500 mg (500 mg Oral Given 04/19/19 0214)     Initial Impression / Assessment and Plan / ED Course  I have reviewed the triage vital signs and the nursing notes.  Pertinent labs & imaging results that were available during my care of the patient were reviewed by me and considered in my medical decision making (see chart for details).        Tdap booster UTD; booster given 01/2019. Laceration occurred < 8 hours prior to repair which was well tolerated. Pt has no comorbidities to effect normal wound healing. Discussed suture home care with pt and answered questions. Started on Keflex given laceration sustained with dirty kitchen knife.   Also complaining of right forearm pain.  He is right-hand dominant and recently resumed working as a Investment banker, operational.  Suspect tendinitis secondary to overuse.  He has been encouraged to take Aleve twice a day and to apply ice for inflammation.  Patient is hemodynamically stable with no complaints prior to discharge.     Final  Clinical Impressions(s) / ED Diagnoses   Final diagnoses:  Laceration of left thumb without foreign body without damage to nail, initial encounter  Forearm tendonitis    ED Discharge Orders         Ordered    cephALEXin (KEFLEX) 500 MG capsule  4 times daily     04/19/19 0220           Antony Madura, PA-C 04/19/19 0226    Mesner, Barbara Cower, MD 04/19/19 [phone]

## 2019-04-19 NOTE — Discharge Instructions (Signed)
Take Keflex as prescribed to prevent infection.  We recommend Tylenol or ibuprofen for management of pain.

## 2019-05-28 ENCOUNTER — Emergency Department (HOSPITAL_COMMUNITY): Payer: Commercial Managed Care - PPO

## 2019-05-28 ENCOUNTER — Emergency Department (HOSPITAL_COMMUNITY)
Admission: EM | Admit: 2019-05-28 | Discharge: 2019-05-28 | Disposition: A | Discharge status: Home / Self Care | Payer: Commercial Managed Care - PPO | Attending: Emergency Medicine | Admitting: Emergency Medicine

## 2019-05-28 ENCOUNTER — Encounter (HOSPITAL_COMMUNITY): Payer: Self-pay

## 2019-05-28 ENCOUNTER — Other Ambulatory Visit: Payer: Self-pay

## 2019-05-28 DIAGNOSIS — I10 Essential (primary) hypertension: Secondary | ICD-10-CM | POA: Insufficient documentation

## 2019-05-28 DIAGNOSIS — W182XXA Fall in (into) shower or empty bathtub, initial encounter: Secondary | ICD-10-CM | POA: Insufficient documentation

## 2019-05-28 DIAGNOSIS — S2231XA Fracture of one rib, right side, initial encounter for closed fracture: Secondary | ICD-10-CM | POA: Insufficient documentation

## 2019-05-28 DIAGNOSIS — Y92002 Bathroom of unspecified non-institutional (private) residence single-family (private) house as the place of occurrence of the external cause: Secondary | ICD-10-CM | POA: Insufficient documentation

## 2019-05-28 DIAGNOSIS — F17210 Nicotine dependence, cigarettes, uncomplicated: Secondary | ICD-10-CM | POA: Diagnosis not present

## 2019-05-28 DIAGNOSIS — Z79899 Other long term (current) drug therapy: Secondary | ICD-10-CM | POA: Insufficient documentation

## 2019-05-28 DIAGNOSIS — Y93E1 Activity, personal bathing and showering: Secondary | ICD-10-CM | POA: Insufficient documentation

## 2019-05-28 DIAGNOSIS — Y999 Unspecified external cause status: Secondary | ICD-10-CM | POA: Diagnosis not present

## 2019-05-28 DIAGNOSIS — S299XXA Unspecified injury of thorax, initial encounter: Secondary | ICD-10-CM | POA: Diagnosis present

## 2019-05-28 MED ORDER — LIDOCAINE 5 % EX PTCH
1.0000 | MEDICATED_PATCH | CUTANEOUS | Status: DC
  Administered 2019-05-28: 1 via TRANSDERMAL
  Filled 2019-05-28: qty 1

## 2019-05-28 NOTE — Discharge Instructions (Addendum)
You have been diagnosed today with fracture of your right ninth rib.  At this time there does not appear to be the presence of an emergent medical condition, however there is always the potential for conditions to change. Please read and follow the below instructions.  Please return to the Emergency Department immediately for any new or worsening symptom. Please be sure to follow up with your Primary Care Provider within one week regarding your visit today; please call their office to schedule an appointment even if you are feeling better for a follow-up visit. Please continue to use your incentive spirometer 10 times per hour while awake to help prevent pneumonia.  Use a pillow when coughing.  You may use over-the-counter anti-inflammatories such as Tylenol and ibuprofen to help with pain as instructed on the packaging.  Call your primary care doctor today to schedule a follow-up appointment.  Additionally you may follow-up with the specialists Dr. Erlinda Hong at [facility] as needed regarding your rib fracture.  Get help right away if: You have difficulty breathing or you are short of breath. You develop a cough that does not stop, or you cough up thick or bloody sputum. You have nausea, vomiting, or pain in your abdomen. Your pain gets worse and medicine does not help. You have fever Any new/concerning or worsening symptoms  Please read the additional information packets attached to your discharge summary.

## 2019-05-28 NOTE — ED Provider Notes (Signed)
[facility]-EMERGENCY DEPT Provider Note   CSN: [id number] Arrival date & time: 05/28/19  [id number]    History   Chief Complaint Chief Complaint  Patient presents with  . Rib Injury    HPI Dalton Johnson is a 23 y.o. male with history of hypertension, GERD, polysubstance abuse presents today following fall.  Patient reports that 3 days ago he slipped on a bar of soap in the shower falling and striking his right ribs on a rail.  Patient reports that he was able to catch himself completely falling to the ground reports only right rib pain he denies any other injury.  He denies head injury, loss of consciousness, blood thinner use, neck pain, back pain abdominal pain, or pain to extremities x4.  Patient describes his right rib pain as a constant throbbing aching moderate intensity worse with movement of the right arm, palpation of the ribs and inspiration.  He has taken 1 ibuprofen with some relief of his pain.     HPI  Past Medical History:  Diagnosis Date  . Anxiety   . GERD (gastroesophageal reflux disease)   . Hypertension     Patient Active Problem List   Diagnosis Date Noted  . Heroin overdose (HCC) 01/23/2019  . Rash of hands 01/23/2019  . Essential hypertension 01/23/2019  . Cellulitis 01/23/2019  . Polysubstance abuse [facility](HCC)     Past Surgical History:  Procedure Laterality Date  . HAND SURGERY    . TONSILLECTOMY          Home Medications    Prior to Admission medications   Medication Sig Start Date End Date Taking? Authorizing Provider  cephALEXin (KEFLEX) 500 MG capsule Take 1 capsule (500 mg total) by mouth 4 (four) times daily. 04/19/19   Antony MaduraHumes, Kelly, PA-C  cloNIDine (CATAPRES) 0.1 MG tablet Take 0.1 mg by mouth 3 (three) times daily. 04/22/18   [provider]  hydrOXYzine (ATARAX/VISTARIL) 25 MG tablet Take 1 tablet (25 mg total) by mouth every 6 (six) hours. 12/04/18   Aviva KluverMurray, Alyssa B, PA-C  omeprazole (PRILOSEC) 40 MG capsule  Take 40 mg by mouth daily.    [provider]    Family History No family history on file.  Social History Social History   Tobacco Use  . Smoking status: Current Every Day Smoker    Packs/day: 1.00    Years: 5.00    Pack years: 5.00    Types: Cigarettes  . Smokeless tobacco: Never Used  Substance Use Topics  . Alcohol use: Yes    Alcohol/week: 5.0 standard drinks    Types: 5 Cans of beer per week  . Drug use: Not Currently     Allergies   Patient has no known allergies.   Review of Systems Review of Systems  Constitutional: Negative for chills and fever.  Respiratory: Negative.  Negative for cough and shortness of breath.   Cardiovascular: Chest pain: Right rib pain.  Gastrointestinal: Negative for abdominal pain, nausea and vomiting.  Musculoskeletal: Positive for myalgias (Right rib pain). Negative for back pain and neck pain.  Neurological: Negative.  Negative for weakness, numbness and headaches.     Physical Exam Updated Vital Signs BP 128/88 (BP Location: Right Arm)   Pulse 62   Temp 97.8 F (36.6 C) (Oral)   Resp 18   Ht 6\' 2"  (1.88 m)   Wt 77.1 kg   SpO2 99%   BMI 21.82 kg/m   Physical Exam Constitutional:  General: He is not in acute distress.    Appearance: Normal appearance. He is well-developed. He is not ill-appearing or diaphoretic.  HENT:     Head: Normocephalic and atraumatic.     Right Ear: External ear normal.     Left Ear: External ear normal.     Nose: Nose normal.  Eyes:     General: Vision grossly intact. Gaze aligned appropriately.     Pupils: Pupils are equal, round, and reactive to light.  Neck:     Musculoskeletal: Normal range of motion.     Trachea: Trachea and phonation normal. No tracheal deviation.  Cardiovascular:     Rate and Rhythm: Normal rate and regular rhythm.     Heart sounds: Normal heart sounds.  Pulmonary:     Effort: Pulmonary effort is normal. No tachypnea, accessory muscle usage or  respiratory distress.     Breath sounds: Normal breath sounds and air entry. No decreased breath sounds.  Chest:     Chest wall: Tenderness present. No deformity or crepitus.    Abdominal:     General: There is no distension.     Palpations: Abdomen is soft.     Tenderness: There is no abdominal tenderness. There is no guarding or rebound.  Musculoskeletal: Normal range of motion.     Comments: No midline C/T/L spinal tenderness to palpation, no paraspinal muscle tenderness, no deformity, crepitus, or step-off noted. No sign of injury to the neck or back. - Hips stable to compression bilaterally without pain.  Patient is ambulatory without assistance or difficulty.  Some increase of pain with movement of the right arm, no pain of the shoulder.  All major extremities brought through range of motion without pain or crepitus.  Skin:    General: Skin is warm and dry.  Neurological:     Mental Status: He is alert.     GCS: GCS eye subscore is 4. GCS verbal subscore is 5. GCS motor subscore is 6.     Comments: Speech is clear and goal oriented, follows commands Major Cranial nerves without deficit, no facial droop Moves extremities without ataxia, coordination intact  Psychiatric:        Behavior: Behavior normal.    ED Treatments / Results  Labs (all labs ordered are listed, but only abnormal results are displayed) Labs Reviewed - No data to display  EKG None  Radiology Dg Ribs Unilateral W/chest Right  Result Date: 05/28/2019 CLINICAL DATA:  Fall EXAM: RIGHT RIBS AND CHEST - 3+ VIEW COMPARISON:  01/23/2019 FINDINGS: Heart size and vascularity normal. Negative for infiltrate or effusion Fracture right 9 rib anteriorly with mild displacement. Minimal right effusion. No pneumothorax. IMPRESSION: Fracture 9 rib anteriorly with minimal right pleural effusion. Electronically Signed   By: Franchot Gallo M.D.   On: 05/28/2019 11:06    Procedures Procedures (including critical care time)   Medications Ordered in ED Medications  lidocaine (LIDODERM) 5 % 1 patch (1 patch Transdermal Patch Applied 05/28/19 1217)     Initial Impression / Assessment and Plan / ED Course  I have reviewed the triage vital signs and the nursing notes.  Pertinent labs & imaging results that were available during my care of the patient were reviewed by me and considered in my medical decision making (see chart for details).     DG Right Ribs with Chest:  IMPRESSION: Fracture 9 rib anteriorly with minimal right pleural effusion.  23 year old male with single nondisplaced rib fracture on chest x-ray after  fall that occurred 3 days ago. Tender to palpation in the area. Patient with good tidal volume, no hemoptysis, no decreased breath sounds and no pneumothorax on x-ray. Patient given instructions regarding rib fractures including use of incentive spirometer 10 times per hour to prevent pneumonia; use of pillow when coughing and taking NSAIDs.  As patient with history of polysubstance abuse will avoid narcotic medications at this time. Patient also advised to followup with orthopedics if not improved in one week.  I have also discussed reasons to return immediately to the ER including difficulty breathing, hemoptysis.  Patient expresses understanding and agrees with plan.  At this time there does not appear to be any evidence of an acute emergency medical condition and the patient appears stable for discharge with appropriate outpatient follow up. Diagnosis was discussed with patient who verbalizes understanding of care plan and is agreeable to discharge. I have discussed return precautions with patient who verbalizes understanding of return precautions. Patient encouraged to follow-up with their PCP. All questions answered.  Patient's case discussed with Dr. Juleen ChinaKohut who agrees with plan to discharge with follow-up.   Note: Portions of this report may have been transcribed using voice recognition software.  Every effort was made to ensure accuracy; however, inadvertent computerized transcription errors may still be present. Final Clinical Impressions(s) / ED Diagnoses   Final diagnoses:  Closed fracture of one rib of right side, initial encounter    ED Discharge Orders    None       Elizabeth PalauMorelli, Deamonte Sayegh A, PA-C 05/28/19 1221    Raeford RazorKohut, Stephen, MD 05/28/19 1335

## 2019-05-28 NOTE — ED Triage Notes (Signed)
Pt fell in the shower 3 days ago, hitting right side on a rail. Pt has some bruising to the right side rib cage, and pain with inspiration.

## 2019-05-28 NOTE — ED Notes (Signed)
Bed: [facility]94 Expected date:  Expected time:  Means of arrival:  Comments: EMS-rib pain from fall

## 2019-05-29 ENCOUNTER — Telehealth: Payer: Self-pay | Admitting: Orthopaedic Surgery

## 2019-05-29 ENCOUNTER — Ambulatory Visit (INDEPENDENT_AMBULATORY_CARE_PROVIDER_SITE_OTHER): Payer: Commercial Managed Care - PPO | Admitting: Orthopaedic Surgery

## 2019-05-29 DIAGNOSIS — S2231XA Fracture of one rib, right side, initial encounter for closed fracture: Secondary | ICD-10-CM

## 2019-05-29 MED ORDER — TRAMADOL HCL 50 MG PO TABS: 50.0000 mg | ORAL_TABLET | Freq: Two times a day (BID) | ORAL | 2 refills | Status: DC | PRN

## 2019-05-29 NOTE — Telephone Encounter (Signed)
Patient called was just seen today and need to ask a question about one of the medications Tramadol.  Please call patient @ [phone]

## 2019-05-29 NOTE — Telephone Encounter (Signed)
I called patient and advised. He has actually picked up tramadol and spoken with the pharmacist already. He states that he is not noticing a lot of relief with the tramadol at this point. We went over instructions for wearing the rib belt. Patient was advised to call if continued questions/problems.

## 2019-05-29 NOTE — Telephone Encounter (Signed)
Called patient back to see what question he had and he  Would like to know if medication messes with his High blood pressure. Takes Lisinopril daily. Would like call back.   CB [phone]

## 2019-05-29 NOTE — Telephone Encounter (Signed)
Likely not, but best to check with pharmacist

## 2019-05-29 NOTE — Progress Notes (Signed)
Office Visit Note   Patient: Dalton Johnson           Date of Birth: [birth year]           MRN: [id number] Visit Date: 05/29/2019              Requested by: No referring provider defined for this encounter. PCP: Patient, No Pcp Per   Assessment & Plan: Visit Diagnoses:  1. Closed fracture of one rib of right side, initial encounter     Plan: Impression is nondisplaced right ninth rib fracture.  We will plan on treating this nonoperatively.  Prescription for tramadol provided today.  We did provide him with a brace for symptomatic relief.  Patient will call if he needs a refill on the tramadol.  Out of work note for a week.  I am happy to extend beyond a week if he needs it.  He can follow-up with [place] as needed.  Follow-Up Instructions: No follow-ups on file.   Orders:  No orders of the defined types were placed in this encounter.  Meds ordered this encounter  Medications  . traMADol (ULTRAM) 50 MG tablet    Sig: Take 1-2 tablets (50-100 mg total) by mouth 2 (two) times daily as needed.    Dispense:  30 tablet    Refill:  2      Procedures: No procedures performed   Clinical Data: No additional findings.   Subjective: Chief Complaint  Patient presents with  . Chest - Fracture, Pain, Injury    Dalton Johnson is a 23 year old gentleman who comes in for evaluation of an acute right rib fracture that he sustained yesterday when he fell in the shower.  He was evaluated in the ED and given follow-up with [place] today.  He states that his pain is severe but he denies any problems with breathing or shortness of breath.  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  His job requires him to do a lot of lifting.   Review of Systems  Constitutional: Negative.   All other systems reviewed and are negative.    Objective: Vital Signs: There were no vitals taken for this visit.  Physical Exam Vitals signs and nursing note reviewed.  Constitutional:      Appearance: He is  well-developed.  HENT:     Head: Normocephalic and atraumatic.  Eyes:     Pupils: Pupils are equal, round, and reactive to light.  Neck:     Musculoskeletal: Neck supple.  Pulmonary:     Effort: Pulmonary effort is normal.  Abdominal:     Palpations: Abdomen is soft.  Musculoskeletal: Normal range of motion.  Skin:    General: Skin is warm.  Neurological:     Mental Status: He is alert and oriented to person, place, and time.  Psychiatric:        Behavior: Behavior normal.        Thought Content: Thought content normal.        Judgment: Judgment normal.     Ortho Exam Thoracic exam shows no skin tenting.  He is tender to palpation over the rib fracture.  There is no gross motion or crepitus.  He has no shortness of breath or chest pain. Specialty Comments:  No specialty comments available.  Imaging: Dg Ribs Unilateral W/chest Right  Result Date: 05/28/2019 CLINICAL DATA:  Fall EXAM: RIGHT RIBS AND CHEST - 3+ VIEW COMPARISON:  01/23/2019 FINDINGS: Heart size and vascularity normal. Negative for infiltrate or  effusion Fracture right 9 rib anteriorly with mild displacement. Minimal right effusion. No pneumothorax. IMPRESSION: Fracture 9 rib anteriorly with minimal right pleural effusion. Electronically Signed   By: Marlan Palauharles  Clark M.D.   On: 05/28/2019 11:06     PMFS History: Patient Active Problem List   Diagnosis Date Noted  . Right rib fracture 05/29/2019  . Heroin overdose (HCC) 01/23/2019  . Rash of hands 01/23/2019  . Essential hypertension 01/23/2019  . Cellulitis 01/23/2019  . Polysubstance abuse [facility](HCC)    Past Medical History:  Diagnosis Date  . Anxiety   . GERD (gastroesophageal reflux disease)   . Hypertension     No family history on file.  Past Surgical History:  Procedure Laterality Date  . HAND SURGERY    . TONSILLECTOMY     Social History   Occupational History  . Not on file  Tobacco Use  . Smoking status: Current Every Day Smoker     Packs/day: 1.00    Years: 5.00    Pack years: 5.00    Types: Cigarettes  . Smokeless tobacco: Never Used  Substance and Sexual Activity  . Alcohol use: Yes    Alcohol/week: 5.0 standard drinks    Types: 5 Cans of beer per week  . Drug use: Not Currently  . Sexual activity: Yes    Birth control/protection: None

## 2019-06-01 ENCOUNTER — Encounter: Payer: Self-pay | Admitting: Family Medicine

## 2019-06-01 ENCOUNTER — Ambulatory Visit (INDEPENDENT_AMBULATORY_CARE_PROVIDER_SITE_OTHER): Payer: Commercial Managed Care - PPO | Admitting: Family Medicine

## 2019-06-01 ENCOUNTER — Emergency Department (HOSPITAL_COMMUNITY): Payer: Commercial Managed Care - PPO

## 2019-06-01 ENCOUNTER — Inpatient Hospital Stay (HOSPITAL_COMMUNITY)
Admission: EM | Admit: 2019-06-01 | Discharge: 2019-06-02 | DRG: 917 | Disposition: A | Discharge status: Home / Self Care | Payer: Commercial Managed Care - PPO | Attending: Pulmonary Disease | Admitting: Pulmonary Disease

## 2019-06-01 ENCOUNTER — Other Ambulatory Visit: Payer: Self-pay

## 2019-06-01 ENCOUNTER — Inpatient Hospital Stay (HOSPITAL_COMMUNITY): Payer: Commercial Managed Care - PPO

## 2019-06-01 ENCOUNTER — Other Ambulatory Visit: Payer: Self-pay | Admitting: Family Medicine

## 2019-06-01 DIAGNOSIS — K219 Gastro-esophageal reflux disease without esophagitis: Secondary | ICD-10-CM | POA: Diagnosis present

## 2019-06-01 DIAGNOSIS — J9600 Acute respiratory failure, unspecified whether with hypoxia or hypercapnia: Secondary | ICD-10-CM | POA: Diagnosis present

## 2019-06-01 DIAGNOSIS — Z1159 Encounter for screening for other viral diseases: Secondary | ICD-10-CM | POA: Diagnosis not present

## 2019-06-01 DIAGNOSIS — T428X1A Poisoning by antiparkinsonism drugs and other central muscle-tone depressants, accidental (unintentional), initial encounter: Principal | ICD-10-CM | POA: Diagnosis present

## 2019-06-01 DIAGNOSIS — X58XXXA Exposure to other specified factors, initial encounter: Secondary | ICD-10-CM | POA: Diagnosis present

## 2019-06-01 DIAGNOSIS — S2231XD Fracture of one rib, right side, subsequent encounter for fracture with routine healing: Secondary | ICD-10-CM | POA: Diagnosis not present

## 2019-06-01 DIAGNOSIS — I10 Essential (primary) hypertension: Secondary | ICD-10-CM | POA: Diagnosis present

## 2019-06-01 DIAGNOSIS — Z79899 Other long term (current) drug therapy: Secondary | ICD-10-CM | POA: Diagnosis not present

## 2019-06-01 DIAGNOSIS — M542 Cervicalgia: Secondary | ICD-10-CM | POA: Diagnosis not present

## 2019-06-01 DIAGNOSIS — T424X1A Poisoning by benzodiazepines, accidental (unintentional), initial encounter: Secondary | ICD-10-CM | POA: Diagnosis present

## 2019-06-01 DIAGNOSIS — Z4659 Encounter for fitting and adjustment of other gastrointestinal appliance and device: Secondary | ICD-10-CM

## 2019-06-01 DIAGNOSIS — F17210 Nicotine dependence, cigarettes, uncomplicated: Secondary | ICD-10-CM | POA: Diagnosis present

## 2019-06-01 DIAGNOSIS — N179 Acute kidney failure, unspecified: Secondary | ICD-10-CM | POA: Diagnosis present

## 2019-06-01 DIAGNOSIS — T50904A Poisoning by unspecified drugs, medicaments and biological substances, undetermined, initial encounter: Secondary | ICD-10-CM | POA: Diagnosis present

## 2019-06-01 DIAGNOSIS — S2231XA Fracture of one rib, right side, initial encounter for closed fracture: Secondary | ICD-10-CM | POA: Diagnosis present

## 2019-06-01 DIAGNOSIS — F419 Anxiety disorder, unspecified: Secondary | ICD-10-CM | POA: Diagnosis present

## 2019-06-01 DIAGNOSIS — G4700 Insomnia, unspecified: Secondary | ICD-10-CM | POA: Diagnosis present

## 2019-06-01 DIAGNOSIS — T50901A Poisoning by unspecified drugs, medicaments and biological substances, accidental (unintentional), initial encounter: Secondary | ICD-10-CM | POA: Diagnosis present

## 2019-06-01 DIAGNOSIS — T50904D Poisoning by unspecified drugs, medicaments and biological substances, undetermined, subsequent encounter: Secondary | ICD-10-CM | POA: Diagnosis not present

## 2019-06-01 DIAGNOSIS — Y92009 Unspecified place in unspecified non-institutional (private) residence as the place of occurrence of the external cause: Secondary | ICD-10-CM

## 2019-06-01 LAB — CBC
HCT: 39.8 % (ref 39.0–52.0)
Hemoglobin: 14.5 g/dL (ref 13.0–17.0)
MCH: 33 pg (ref 26.0–34.0)
MCHC: 36.4 g/dL — ABNORMAL HIGH (ref 30.0–36.0)
MCV: 90.7 fL (ref 80.0–100.0)
Platelets: 269 10*3/uL (ref 150–400)
RBC: 4.39 MIL/uL (ref 4.22–5.81)
RDW: 12.3 % (ref 11.5–15.5)
WBC: 7.8 10*3/uL (ref 4.0–10.5)
nRBC: 0 % (ref 0.0–0.2)

## 2019-06-01 LAB — POCT I-STAT 7, (LYTES, BLD GAS, ICA,H+H)
Acid-base deficit: 4 mmol/L — ABNORMAL HIGH (ref 0.0–2.0)
Bicarbonate: 20.6 mmol/L (ref 20.0–28.0)
Calcium, Ion: 1.2 mmol/L (ref 1.15–1.40)
HCT: 37 % — ABNORMAL LOW (ref 39.0–52.0)
Hemoglobin: 12.6 g/dL — ABNORMAL LOW (ref 13.0–17.0)
O2 Saturation: 96 %
Patient temperature: 98.6
Potassium: 4.1 mmol/L (ref 3.5–5.1)
Sodium: 139 mmol/L (ref 135–145)
TCO2: 22 mmol/L (ref 22–32)
pCO2 arterial: 34.8 mmHg (ref 32.0–48.0)
pH, Arterial: 7.38 (ref 7.350–7.450)
pO2, Arterial: 82 mmHg — ABNORMAL LOW (ref 83.0–108.0)

## 2019-06-01 LAB — CBC WITH DIFFERENTIAL/PLATELET
Abs Immature Granulocytes: 0.03 10*3/uL (ref 0.00–0.07)
Basophils Absolute: 0 10*3/uL (ref 0.0–0.1)
Basophils Relative: 0 %
Eosinophils Absolute: 0.2 10*3/uL (ref 0.0–0.5)
Eosinophils Relative: 2 %
HCT: 40.3 % (ref 39.0–52.0)
Hemoglobin: 13.7 g/dL (ref 13.0–17.0)
Immature Granulocytes: 0 %
Lymphocytes Relative: 12 %
Lymphs Abs: 1.1 10*3/uL (ref 0.7–4.0)
MCH: 30.8 pg (ref 26.0–34.0)
MCHC: 34 g/dL (ref 30.0–36.0)
MCV: 90.6 fL (ref 80.0–100.0)
Monocytes Absolute: 0.6 10*3/uL (ref 0.1–1.0)
Monocytes Relative: 7 %
Neutro Abs: 7.3 10*3/uL (ref 1.7–7.7)
Neutrophils Relative %: 79 %
Platelets: 243 10*3/uL (ref 150–400)
RBC: 4.45 MIL/uL (ref 4.22–5.81)
RDW: 12.3 % (ref 11.5–15.5)
WBC: 9.2 10*3/uL (ref 4.0–10.5)
nRBC: 0 % (ref 0.0–0.2)

## 2019-06-01 LAB — RAPID URINE DRUG SCREEN, HOSP PERFORMED
Amphetamines: NOT DETECTED
Barbiturates: NOT DETECTED
Benzodiazepines: POSITIVE — AB
Cocaine: NOT DETECTED
Opiates: NOT DETECTED
Tetrahydrocannabinol: POSITIVE — AB

## 2019-06-01 LAB — TRIGLYCERIDES: Triglycerides: 1740 mg/dL — ABNORMAL HIGH (ref <150)

## 2019-06-01 LAB — COMPREHENSIVE METABOLIC PANEL
ALT: 35 U/L (ref 0–44)
AST: 50 U/L — ABNORMAL HIGH (ref 15–41)
Albumin: 4 g/dL (ref 3.5–5.0)
Alkaline Phosphatase: 74 U/L (ref 38–126)
Anion gap: 12 (ref 5–15)
BUN: 15 mg/dL (ref 6–20)
CO2: 20 mmol/L — ABNORMAL LOW (ref 22–32)
Calcium: 8.8 mg/dL — ABNORMAL LOW (ref 8.9–10.3)
Chloride: 101 mmol/L (ref 98–111)
Creatinine, Ser: 1.27 mg/dL — ABNORMAL HIGH (ref 0.61–1.24)
GFR calc Af Amer: >60 mL/min (ref >60)
GFR calc non Af Amer: >60 mL/min (ref >60)
Glucose, Bld: 91 mg/dL (ref 70–99)
Potassium: 3.6 mmol/L (ref 3.5–5.1)
Sodium: 133 mmol/L — ABNORMAL LOW (ref 135–145)
Total Bilirubin: 1.2 mg/dL (ref 0.3–1.2)
Total Protein: 6.8 g/dL (ref 6.5–8.1)

## 2019-06-01 LAB — ETHANOL: Alcohol, Ethyl (B): <10 mg/dL (ref <10)

## 2019-06-01 LAB — ACETAMINOPHEN LEVEL: Acetaminophen (Tylenol), Serum: <10 ug/mL — ABNORMAL LOW (ref 10–30)

## 2019-06-01 LAB — CBG MONITORING, ED: Glucose-Capillary: 83 mg/dL (ref 70–99)

## 2019-06-01 LAB — MAGNESIUM: Magnesium: 1.9 mg/dL (ref 1.7–2.4)

## 2019-06-01 LAB — SALICYLATE LEVEL: Salicylate Lvl: <7 mg/dL (ref 2.8–30.0)

## 2019-06-01 LAB — CK: Total CK: 1550 U/L — ABNORMAL HIGH (ref 49–397)

## 2019-06-01 LAB — CREATININE, SERUM
Creatinine, Ser: 1.15 mg/dL (ref 0.61–1.24)
GFR calc Af Amer: >60 mL/min
GFR calc non Af Amer: >60 mL/min

## 2019-06-01 LAB — SARS CORONAVIRUS 2 BY RT PCR (HOSPITAL ORDER, PERFORMED IN ~~LOC~~ HOSPITAL LAB): SARS Coronavirus 2: NEGATIVE

## 2019-06-01 LAB — MRSA PCR SCREENING: MRSA by PCR: NEGATIVE

## 2019-06-01 MED ORDER — LACTATED RINGERS IV SOLN
INTRAVENOUS | Status: DC
  Administered 2019-06-02: 03:00:00 via INTRAVENOUS

## 2019-06-01 MED ORDER — VITAMIN D-3 125 MCG (5000 UT) PO TABS: 1.0000 | ORAL_TABLET | Freq: Every day | ORAL | 3 refills | Status: DC

## 2019-06-01 MED ORDER — BACLOFEN 10 MG PO TABS: 5.0000 mg | ORAL_TABLET | Freq: Three times a day (TID) | ORAL | 1 refills | Status: DC | PRN

## 2019-06-01 MED ORDER — PROPOFOL 1000 MG/100ML IV EMUL
5.0000 ug/kg/min | INTRAVENOUS | Status: DC
  Administered 2019-06-01: 86.58 ug/kg/min via INTRAVENOUS
  Administered 2019-06-02: 07:00:00 50 ug/kg/min via INTRAVENOUS
  Administered 2019-06-02: 02:00:00 40 ug/kg/min via INTRAVENOUS
  Filled 2019-06-01 (×4): qty 100

## 2019-06-01 MED ORDER — FENTANYL CITRATE (PF) 100 MCG/2ML IJ SOLN
INTRAMUSCULAR | Status: AC
  Filled 2019-06-01: qty 2

## 2019-06-01 MED ORDER — ATROPINE SULFATE 1 MG/10ML IJ SOSY
PREFILLED_SYRINGE | INTRAMUSCULAR | Status: AC
  Filled 2019-06-01: qty 10

## 2019-06-01 MED ORDER — PANTOPRAZOLE SODIUM 40 MG IV SOLR
40.0000 mg | Freq: Every day | INTRAVENOUS | Status: DC
  Administered 2019-06-01: 40 mg via INTRAVENOUS
  Filled 2019-06-01: qty 40

## 2019-06-01 MED ORDER — ENOXAPARIN SODIUM 40 MG/0.4ML ~~LOC~~ SOLN: 40.0000 mg | SUBCUTANEOUS | Status: DC

## 2019-06-01 MED ORDER — NALOXONE HCL 2 MG/2ML IJ SOSY
1.0000 mg | PREFILLED_SYRINGE | Freq: Once | INTRAMUSCULAR | Status: AC
  Administered 2019-06-01: 16:00:00 1 mg via INTRAVENOUS
  Filled 2019-06-01: qty 2

## 2019-06-01 MED ORDER — NALOXONE HCL 2 MG/2ML IJ SOSY
1.0000 mg | PREFILLED_SYRINGE | Freq: Once | INTRAMUSCULAR | Status: AC
  Administered 2019-06-01: 17:00:00 1 mg via INTRAVENOUS

## 2019-06-01 MED ORDER — SUCCINYLCHOLINE CHLORIDE 20 MG/ML IJ SOLN
150.0000 mg | Freq: Once | INTRAMUSCULAR | Status: AC
  Administered 2019-06-01: 150 mg via INTRAVENOUS
  Filled 2019-06-01: qty 7.5

## 2019-06-01 MED ORDER — PROPOFOL 1000 MG/100ML IV EMUL
INTRAVENOUS | Status: AC
  Administered 2019-06-01 (×2)
  Filled 2019-06-01: qty 100

## 2019-06-01 MED ORDER — ETOMIDATE 2 MG/ML IV SOLN
20.0000 mg | Freq: Once | INTRAVENOUS | Status: AC
  Administered 2019-06-01: 17:00:00 20 mg via INTRAVENOUS

## 2019-06-01 MED ORDER — VITAMIN K2 100 MCG PO CAPS: 100.0000 ug | ORAL_CAPSULE | Freq: Every day | ORAL | 3 refills | Status: DC

## 2019-06-01 MED ORDER — MIDAZOLAM HCL 2 MG/2ML IJ SOLN
INTRAMUSCULAR | Status: AC
  Administered 2019-06-01: 18:00:00 2 mg via INTRAVENOUS
  Filled 2019-06-01: qty 4

## 2019-06-01 MED ORDER — SODIUM CHLORIDE 0.9 % IV BOLUS
1000.0000 mL | Freq: Once | INTRAVENOUS | Status: AC
  Administered 2019-06-01: 1000 mL via INTRAVENOUS

## 2019-06-01 MED ORDER — MAGNESIUM 400 MG PO CAPS: 400.0000 mg | ORAL_CAPSULE | Freq: Every day | ORAL | 6 refills | Status: DC

## 2019-06-01 NOTE — ED Notes (Signed)
MD orders for Versed.  2mg  given now.  With verbal orders for 2mg  more PRN.

## 2019-06-01 NOTE — ED Notes (Signed)
Pt becoming increasingly more drowsy and difficult to rouse to painful stimuli.  Airway remains patent and oxygen applied.  PA in with pt with orders.  IV patent from EMS

## 2019-06-01 NOTE — ED Provider Notes (Signed)
[facility] Provider Note   CSN: [id number] Arrival date & time: 06/01/19  1548     History   Chief Complaint No chief complaint on file.   HPI Valerie RoysJulian Yanes II is a 23 y.o. male.     HPI  Patient is a 23 year old male with past medical history of anxiety, GERD, hypertension, polysubstance use with heroin overdose presenting for overdose.  His girlfriend called 911, and reported the scene by EMS stated that he took approximately 19 tablets of baclofen, and 30 tablets of 30 mg Restoril were also missing.  These are both his prescriptions.  He was at a medical appointment between 8 9 AM this morning, and this episode occurred after that time for his presentation at 4:30 PM.    Level 5 caveat somnolence.  Past Medical History:  Diagnosis Date  . Anxiety   . GERD (gastroesophageal reflux disease)   . Hypertension     Patient Active Problem List   Diagnosis Date Noted  . Right rib fracture 05/29/2019  . Heroin overdose (HCC) 01/23/2019  . Rash of hands 01/23/2019  . Essential hypertension 01/23/2019  . Cellulitis 01/23/2019  . Polysubstance abuse [facility](HCC)     Past Surgical History:  Procedure Laterality Date  . HAND SURGERY    . TONSILLECTOMY          Home Medications    Prior to Admission medications   Medication Sig Start Date End Date Taking? Authorizing Provider  temazepam (RESTORIL) 30 MG capsule Take 30 mg by mouth daily.  05/02/19  Yes [provider]  traMADol (ULTRAM) 50 MG tablet Take 1-2 tablets (50-100 mg total) by mouth 2 (two) times daily as needed. 05/29/19  Yes Tarry KosXu, Naiping M, MD  baclofen (LIORESAL) 10 MG tablet Take 0.5-1 tablets (5-10 mg total) by mouth 3 (three) times daily as needed for muscle spasms. 06/01/19   Hilts, Casimiro NeedleMichael, MD  cephALEXin (KEFLEX) 500 MG capsule Take 1 capsule (500 mg total) by mouth 4 (four) times daily. 04/19/19   Antony MaduraHumes, Kelly, PA-C  Cholecalciferol (VITAMIN D-3) 125 MCG (5000  UT) TABS Take 1 tablet by mouth daily. 06/01/19   Hilts, Casimiro NeedleMichael, MD  cloNIDine (CATAPRES) 0.1 MG tablet Take 0.1 mg by mouth 3 (three) times daily. 04/22/18   [provider]  gabapentin (NEURONTIN) 300 MG capsule 1 TABLET BY MOUTH 3 TIMES A DAY AND 2 EVERY NIGHT 05/11/19   [provider]  hydrOXYzine (ATARAX/VISTARIL) 25 MG tablet Take 1 tablet (25 mg total) by mouth every 6 (six) hours. 12/04/18   Aviva KluverMurray, Amere Iott B, PA-C  lisinopril (ZESTRIL) 20 MG tablet Take 20 mg by mouth daily. 04/08/19   [provider]  Magnesium 400 MG CAPS Take 400 mg by mouth daily. 06/01/19   Hilts, Casimiro NeedleMichael, MD  Menaquinone-7 (VITAMIN K2) 100 MCG CAPS Take 100 mcg by mouth daily. 06/01/19   Hilts, Casimiro NeedleMichael, MD  omeprazole (PRILOSEC) 40 MG capsule Take 40 mg by mouth daily.    [provider]  pantoprazole (PROTONIX) 40 MG tablet TAKE 1 TABLET BY MOUTH EVERY DAY (NOT COVERED  ONLY PAYS FOR 90 EVERY 365 DAYS) 05/04/19   [provider]  PARoxetine (PAXIL) 30 MG tablet Take 30 mg by mouth daily.  04/08/19   [provider]    Family History No family history on file.  Social History Social History   Tobacco Use  . Smoking status: Current Every Day Smoker    Packs/day: 1.00  Years: 5.00    Pack years: 5.00    Types: Cigarettes  . Smokeless tobacco: Never Used  Substance Use Topics  . Alcohol use: Yes    Alcohol/week: 5.0 standard drinks    Types: 5 Cans of beer per week  . Drug use: Not Currently     Allergies   Patient has no known allergies.   Review of Systems Review of Systems  Level 5 caveat somnolence.  Physical Exam Updated Vital Signs BP 119/79   Pulse (!) 52   Temp 99 F (37.2 C) (Oral)   Resp 16   SpO2 100%   Physical Exam Vitals signs and nursing note reviewed.  Constitutional:      Appearance: He is well-developed.     Comments: Alert to sternal rub only.  HENT:     Head: Normocephalic and atraumatic.  Eyes:      Conjunctiva/sclera: Conjunctivae normal.     Pupils: Pupils are equal, round, and reactive to light.     Comments: Pupils 5 mm, equal, round, reactive b/l.   Neck:     Musculoskeletal: Normal range of motion and neck supple.  Cardiovascular:     Rate and Rhythm: Normal rate and regular rhythm.     Heart sounds: S1 normal and S2 normal. No murmur.  Pulmonary:     Effort: Pulmonary effort is normal.     Breath sounds: Normal breath sounds. No wheezing or rales.  Abdominal:     General: There is no distension.     Palpations: Abdomen is soft.     Tenderness: There is no abdominal tenderness. There is no guarding.  Musculoskeletal: Normal range of motion.        General: No deformity.  Lymphadenopathy:     Cervical: No cervical adenopathy.  Skin:    General: Skin is warm and dry.     Findings: No erythema or rash.  Neurological:     GCS: GCS eye subscore is 2. GCS verbal subscore is 3. GCS motor subscore is 5.     Comments: Alert only to sternal rub.  Somnolent.  Makes nonsensical speech. Localizes to pain.   Psychiatric:        Behavior: Behavior normal.      ED Treatments / Results  Labs (all labs ordered are listed, but only abnormal results are displayed) Labs Reviewed  ACETAMINOPHEN LEVEL - Abnormal; Notable for the following components:      Result Value   Acetaminophen (Tylenol), Serum <10 (*)    All other components within normal limits  CBC WITH DIFFERENTIAL/PLATELET  ETHANOL  SALICYLATE LEVEL  COMPREHENSIVE METABOLIC PANEL  RAPID URINE DRUG SCREEN, HOSP PERFORMED  MAGNESIUM  CK  CBG MONITORING, ED    EKG EKG Interpretation  Date/Time:  Monday June 01 2019 15:59:17 EDT Ventricular Rate:  69 PR Interval:    QRS Duration: 104 QT Interval:  398 QTC Calculation: 427 R Axis:   52 Text Interpretation:  Sinus rhythm Confirmed by Donnetta Hutchingook, Brian ([id number]) on 06/01/2019 4:32:27 PM   Radiology No results found.  Procedures Procedures (including critical care  time)  Medications Ordered in ED Medications  naloxone [facility](NARCAN) injection 1 mg (1 mg Intravenous Given 06/01/19 1620)  sodium chloride 0.9 % bolus 1,000 mL (0 mLs Intravenous Stopped 06/01/19 1655)  naloxone (NARCAN) injection 1 mg (1 mg Intravenous Given 06/01/19 1630)  etomidate (AMIDATE) injection 20 mg (20 mg Intravenous Given 06/01/19 1720)  succinylcholine (ANECTINE) injection 150 mg (150 mg Intravenous Given 06/01/19 1721)  Initial Impression / Assessment and Plan / ED Course  I have reviewed the triage vital signs and the nursing notes.  Pertinent labs & imaging results that were available during my care of the patient were reviewed by me and considered in my medical decision making (see chart for details).  Clinical Course as of May 31 1941  Mon Jun 01, 2019  1739 Pt currently intubated. Receiving additional fluid.    [AM]  4656 Spoke with Dr. Ander Slade who will come see pt.    [AM]    Clinical Course User Index [AM] Albesa Seen, PA-C       This is a 23 year old male with a past medical history of polysubstance use, anxiety, GERD, hypertension and recent rib fracture presenting for baclofen and temazepam overdose.  Poison control was consulted who states that supportive care is indicated as well as airway protection as needed.  She states that bradycardia and hypotension can be side effect seen with a baclofen overdose.  Occasionally, baclofen overdoses present as "brain death" appearance for up to 7 days.  Patients can experience loss of reflexes and other signs that indicate decreased neurologic activity, however this is baclofen induced.  This is an assumed self-harm until proven otherwise as he reportedly told his girlfriend that he took all this medication to "make the pain stop".  Unclear if this was in reference to his recent rib fracture.  Patient was apparently well this AM an orthopedic appointment between 8 9 AM.  We will obtain CMP, CBC, evaluation  coingestants, CK, magnesium, EKG, portable chest, CT head.  Due to increasing somnolence and concern about airway protection, patient was intubated.  He did have a profoundly bradycardic episode during the intubation rating down into the 20s.  This is since resolved and his heart rate is between 50 and 60.  Suspect comminution of vagal response and baclofen induced bradycardia.  Critical care is consulted.  Patient to be admitted to critical care via Dr. Ander Slade. Appreciate his involvement.   Final Clinical Impressions(s) / ED Diagnoses   Final diagnoses:  Drug overdose, undetermined intent, initial encounter    ED Discharge Orders    None       Tamala Melvyn 06/01/19 1943    Nat Christen, MD 06/08/19 1546

## 2019-06-01 NOTE — ED Triage Notes (Signed)
Pt presents via EMS after OD of Baclofen and Restoril.  Possbile 19 tabs of 10 mg Baclofen tabs missing from Rx. Restoril reportedly filled yesterday with 30 missing.  Pt drowsy and able to rouse.  Confused speech, but does state he was anxious and hurting prior to taking the meds.  Resp deep et spont.

## 2019-06-01 NOTE — H&P (Signed)
NAME:  Dalton Johnson, MRN:  [id number], DOB:  [birth year], LOS: 0 ADMISSION DATE:  06/01/2019, CONSULTATION DATE:  06/01/2019  REFERRING MD:  Renae FickleEd doc, CHIEF COMPLAINT:  Drug overdose   Brief History   Patient presented to the hospital drowsy and difficulty with arousal Had taken multiple pills of baclofen and Restoril About 19 pills of 10 mg baclofen found to be missing  History of present illness   Patient has been having neck pain, rib cage pain Known fracture of ribs-difficulty sleeping Prescribed baclofen for muscle relaxation and sleep aid  Past Medical History   Past Medical History:  Diagnosis Date  . Anxiety   . GERD (gastroesophageal reflux disease)   . Hypertension    Significant Hospital Events   Intubated in the emergency department for airway protection  Consults:  PCCM  Procedures:  Endotracheal intubation 06/01/2019  Significant Diagnostic Tests:  Chest x-ray reviewed by myself is clear   Interim history/subjective:  Intubated in the emergency department for airway protection Some agitation  Objective   Blood pressure 140/90, pulse 100, temperature 99 F (37.2 C), temperature source Oral, resp. rate (!) 26, SpO2 92 %.    Vent Mode: PRVC FiO2 (%):  [100 %] 100 % Set Rate:  [16 bmp] 16 bmp Vt Set:  [650 mL] 650 mL PEEP:  [5 cmH20] 5 cmH20 Plateau Pressure:  [12 cmH20] 12 cmH20   Intake/Output Summary (Last 24 hours) at 06/01/2019 1828 Last data filed at 06/01/2019 1809 Gross per 24 hour  Intake 1040 ml  Output -  Net 1040 ml   There were no vitals filed for this visit.  Examination: General: Young gentleman, does appear comfortable HENT: Moist oral mucosa Lungs: Clear breath sounds Cardiovascular: S1-S2 appreciated Abdomen: Soft, bowel sounds appreciated Extremities: No clubbing, no edema Neuro: Sedate GU:   Resolved Hospital Problem list    Assessment & Plan:  Drug overdose Recent prescription for baclofen and Restoril Multiple  pills missing  Acute respiratory failure -Intubated for airway protection  Poison control was contacted -Risk with baclofen as its very long-acting -Supportive measures  Maintain ventilator -Follow arterial blood gases -Maintain adequate sedation with propofol  There is no evidence of a respiratory tract infection Leave off antibiotics  Follow-up on CT scan of the head  He does have a history of polysubstance abuse Will need counseling, psych evaluation when stable   Best practice:  Diet: Tube feeds per protocol Pain/Anxiety/Delirium protocol (if indicated): Propofol VAP protocol (if indicated): In place DVT prophylaxis: Lovenox, SCD GI prophylaxis: Protonix Mobility: Bedrest Code Status: Full code Disposition: Intensive care unit  Labs   CBC: Recent Labs  Lab 06/01/19 1616  WBC 9.2  NEUTROABS 7.3  HGB 13.7  HCT 40.3  MCV 90.6  PLT 243    Basic Metabolic Panel: Recent Labs  Lab 06/01/19 1616  NA 133*  K 3.6  CL 101  CO2 20*  GLUCOSE 91  BUN 15  CREATININE 1.27*  CALCIUM 8.8*  MG 1.9   GFR: Estimated Creatinine Clearance: 97.8 mL/min (A) (by C-G formula based on SCr of 1.27 mg/dL (H)). Recent Labs  Lab 06/01/19 1616  WBC 9.2    Liver Function Tests: Recent Labs  Lab 06/01/19 1616  AST 50*  ALT 35  ALKPHOS 74  BILITOT 1.2  PROT 6.8  ALBUMIN 4.0   No results for input(s): LIPASE, AMYLASE in the last 168 hours. No results for input(s): AMMONIA in the last 168 hours.  ABG No results  found for: PHART, PCO2ART, PO2ART, HCO3, TCO2, ACIDBASEDEF, O2SAT   Coagulation Profile: No results for input(s): INR, PROTIME in the last 168 hours.  Cardiac Enzymes: Recent Labs  Lab 06/01/19 1616  CKTOTAL 1,550*    HbA1C: No results found for: HGBA1C  CBG: Recent Labs  Lab 06/01/19 1606  GLUCAP 83    Review of Systems:   Review of Systems  Unable to perform ROS: Intubated     Past Medical History  He,  has a past medical history  of Anxiety, GERD (gastroesophageal reflux disease), and Hypertension.   Surgical History    Past Surgical History:  Procedure Laterality Date  . HAND SURGERY    . TONSILLECTOMY       Social History   reports that he has been smoking cigarettes. He has a 5.00 pack-year smoking history. He has never used smokeless tobacco. He reports current alcohol use of about 5.0 standard drinks of alcohol per week. He reports previous drug use.   Family History   His family history is not on file.   Allergies No Known Allergies   Home Medications  Prior to Admission medications   Medication Sig Start Date End Date Taking? Authorizing Provider  baclofen (LIORESAL) 10 MG tablet Take 0.5-1 tablets (5-10 mg total) by mouth 3 (three) times daily as needed for muscle spasms. 06/01/19  Yes Hilts, Legrand Como, MD  Cholecalciferol (VITAMIN D-3) 125 MCG (5000 UT) TABS Take 1 tablet by mouth daily. 06/01/19  Yes Hilts, Legrand Como, MD  gabapentin (NEURONTIN) 300 MG capsule Take 300 mg by mouth See admin instructions. Take 300mg  three times daily and 600mg  at bedtime 05/11/19  Yes [provider]  hydrOXYzine (ATARAX/VISTARIL) 25 MG tablet Take 1 tablet (25 mg total) by mouth every 6 (six) hours. 12/04/18  Yes Valere Dross, Alyssa B, PA-C  lisinopril (ZESTRIL) 20 MG tablet Take 20 mg by mouth daily. 04/08/19  Yes [provider]  Magnesium 400 MG CAPS Take 400 mg by mouth daily. 06/01/19  Yes Hilts, Legrand Como, MD  pantoprazole (PROTONIX) 40 MG tablet Take 40 mg by mouth daily.  05/04/19  Yes [provider]  PARoxetine (PAXIL) 30 MG tablet Take 30 mg by mouth daily.  04/08/19  Yes [provider]  temazepam (RESTORIL) 30 MG capsule Take 30 mg by mouth daily.  05/02/19  Yes [provider]  traMADol (ULTRAM) 50 MG tablet Take 1-2 tablets (50-100 mg total) by mouth 2 (two) times daily as needed. 05/29/19  Yes Leandrew Koyanagi, MD  Menaquinone-7 (VITAMIN K2) 100 MCG CAPS Take 100 mcg by mouth daily.  06/01/19   Hilts, Michael, MD    The patient is critically ill with multiple organ system failure and requires high complexity decision making for assessment and support, frequent evaluation and titration of therapies, advanced monitoring, review of radiographic studies and interpretation of complex data.    Critical Care Time devoted to patient care services, exclusive of separately billable procedures, described in this note is 30 minutes.  Sherrilyn Rist, MD [place], PCCM Cell: [phone]

## 2019-06-01 NOTE — ED Notes (Signed)
Girlfriend Dalton Johnson called with some additional info.  States when she found him he was incontinent of stool and required "kicking to wake up".  Pt currently remains sedated with airway patent and with snoring.

## 2019-06-01 NOTE — ED Notes (Signed)
No change in pt status with 1mg  Narcan IV

## 2019-06-01 NOTE — ED Notes (Signed)
Pt requires propofol for sedation with bolus.  Pt fighting at tube initially very aggressive.

## 2019-06-01 NOTE — Progress Notes (Signed)
Office Visit Note   Patient: Dalton RoysJulian Strzelecki II           Date of Birth: [birth year]           MRN: [id number] Visit Date: 06/01/2019 Requested by: No referring provider defined for this encounter. PCP: Patient, No Pcp Per  Subjective: Chief Complaint  Patient presents with  . Neck - Pain  . pain right ribs - known fracture    HPI: He is here with persistent right rib cage pain.  He has a known fracture.  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  He tried tramadol but did not like the way it made him feel.  He has a history of finger fracture years ago that healed without complication.  He states that he does not smoke on a regular basis but does occasionally.  He also has neck pain since falling and breaking his rib.  No radicular symptoms, but the neck is very tight and painful when he tries to move it.               ROS: No fevers or chills.  All other systems were reviewed and are negative.  Objective: Vital Signs: There were no vitals taken for this visit.  Physical Exam:  General:  Alert and oriented, in no acute distress. Pulm:  Breathing unlabored. Psy:  Normal mood, congruent affect. Skin: No rash or bruising. Neck: Slightly decreased rotation bilaterally but negative Spurling's test.  Diffuse tenderness in the cervical paraspinous muscles.  No bony tenderness. Right rib cage: No crepitation or step-off but he is very tender to palpation at the rib fracture site lateral chest wall.  Imaging: None today.  I reviewed his hospital x-rays with him and showed him his fracture.   Assessment & Plan: 1.  Stable status post right rib fracture -Reassurance, anticipate 6 to 12 weeks total healing time.  Told him that it is okay for him to return to work when pain allows, but it will be uncomfortable for him for a few weeks until the fracture starts to stabilize.  Reassured him that it  should not cause any problems with fracture healing.  If he needs a work note extension I will give that to him. -Trial of baclofen at night to help with muscle spasm.  2.  Neck pain status post fall, probable whiplash injury. -Baclofen as needed.     Procedures: No procedures performed  No notes on file     PMFS History: Patient Active Problem List   Diagnosis Date Noted  . Right rib fracture 05/29/2019  . Heroin overdose (HCC) 01/23/2019  . Rash of hands 01/23/2019  . Essential hypertension 01/23/2019  . Cellulitis 01/23/2019  . Polysubstance abuse [facility](HCC)    Past Medical History:  Diagnosis Date  . Anxiety   . GERD (gastroesophageal reflux disease)   . Hypertension     History reviewed. No pertinent family history.  Past Surgical History:  Procedure Laterality Date  . HAND SURGERY    . TONSILLECTOMY     Social History   Occupational History  . Not on file  Tobacco Use  . Smoking status: Current Every Day Smoker    Packs/day: 1.00    Years: 5.00    Pack years: 5.00    Types: Cigarettes  . Smokeless tobacco: Never Used  Substance and Sexual Activity  . Alcohol use: Yes  Alcohol/week: 5.0 standard drinks    Types: 5 Cans of beer per week  . Drug use: Not Currently  . Sexual activity: Yes    Birth control/protection: None

## 2019-06-01 NOTE — ED Notes (Signed)
meds given @ 1730 and intubation per Dr.Cook without incident.  Pt had episode of bradycardia, HR down to the 20's with Atropine given.  [address] [place] Progression Recent Vital Signs   BP 119/79   Pulse (!) 52   Temp 99 F (37.2 C) (Oral)   Resp 16   SpO2 100%    Past Medical History:  Diagnosis Date  . Anxiety   . GERD (gastroesophageal reflux disease)   . Hypertension      Expected Discharge Date     Diet Order    None       VTE Documentation      Work Intensity Score/Level of Care     @LEVELOFCARE @   Mobility        Consult Orders  (From admission, onward)         Start     Ordered   06/01/19 1643  Consult to respiratory care treatment  Once    Provider:  (Not yet assigned)  Question:  Reason for Consult?  Answer:  intubation   06/01/19 1643           Significant Events    DC Barriers   Abnormal Labs:  Celene Squibb 06/01/2019, 5:39 PM

## 2019-06-01 NOTE — Progress Notes (Signed)
eLink Physician-Brief Progress Note Patient Name: Dalton Johnson DOB: 1996-03-03 MRN: [id number]   Date of Service  06/01/2019  HPI/Events of Note  Brief new admit note: 42 old male admitted for 1). Baclofen/restoril OD. AMS. On vent for low GCS, airway protection. CTH neg. Covid neg. Poison control contacted from ED. ECG: normal PR and Qtc/QR. HR 60.   2) HTN/Anxiety.  Data: Reviewed  Camera: On Vent 6'2". 650/04/12/59%. HR 58, sinus brady. MAP 82.   Discussed with bed side RN.   eICU Interventions  -continue care - on prophylaxis/VAP - wean VT as tolerated. - watch for arythmia. HR did go down to 30 once as per RN. Asked her to notify Poison control about this sinus brady episode. - no PNA. - follow UOP. Mild AKI. - CBG goal < 180.  - consider Psych help once stable.         Elmer Sow 06/01/2019, 10:01 PM

## 2019-06-01 NOTE — ED Provider Notes (Signed)
1740: Patient intubated via the rapid sequence intubation technique using etomidate 20 mg and succinylcholine 50 mg IV.  Successful intubation on first try with a 7.5 endotracheal tube.  Vocal cords were visualized well with a glide scope.  Complication post intubation included a brief episode of hypoxemia and bradycardia.  This responded well to hydration and oxygenation.  Atropine 0.5 mg IV was administered.  Vital signs have remained stable since this event.  Diprovan drip initiated for sedation.   Nat Christen, MD 06/01/19 [phone]

## 2019-06-01 NOTE — ED Notes (Signed)
ED TO INPATIENT HANDOFF REPORT  ED Nurse Name and Phone #: Britta MccreedyMichele Semir Brill RN  S Name/Age/Gender Dalton Johnson 23 y.o. male Room/Bed: 2065087448038C/038C  Code Status   Code Status: Full Code  Home/SNF/Other Home Patient oriented to: self Is this baseline? No   Triage Complete: Triage complete  Chief Complaint Overdose  Triage Note Pt presents via EMS after OD of Baclofen and Restoril.  Possbile 19 tabs of 10 mg Baclofen tabs missing from Rx. Restoril reportedly filled yesterday with 30 missing.  Pt drowsy and able to rouse.  Confused speech, but does state he was anxious and hurting prior to taking the meds.  Resp deep et spont.     Allergies No Known Allergies  Level of Care/Admitting Diagnosis ED Disposition    ED Disposition Condition Comment   Admit  [facility] [[id number]]  Level of Care: ICU [6]  Covid Evaluation: Screening Protocol (No Symptoms)  Diagnosis: Overdose [202577]  Admitting Physician: Tomma LightningLALERE, ADEWALE A [[id number]]  Attending Physician: Tomma LightningLALERE, ADEWALE A [[id number]]  Estimated length of stay: 3 - 4 days  Certification:: I certify this patient will need inpatient services for at least 2 midnights  PT Class (Do Not Modify): Inpatient [101]  PT Acc Code (Do Not Modify): Private [1]       B Medical/Surgery History Past Medical History:  Diagnosis Date  . Anxiety   . GERD (gastroesophageal reflux disease)   . Hypertension    Past Surgical History:  Procedure Laterality Date  . HAND SURGERY    . TONSILLECTOMY       A IV Location/Drains/Wounds Patient Lines/Drains/Airways Status   Active Line/Drains/Airways    Name:   Placement date:   Placement time:   Site:   Days:   Peripheral IV 06/01/19 Right Hand   06/01/19    1530    Hand   less than 1   Peripheral IV 06/01/19 Right Antecubital   06/01/19    1855    Antecubital   less than 1   Airway 7.5 mm   06/01/19    1730     less than 1          Intake/Output Last 24  hours  Intake/Output Summary (Last 24 hours) at 06/01/2019 1917 Last data filed at 06/01/2019 1809 Gross per 24 hour  Intake 1040 ml  Output -  Net 1040 ml    Labs/Imaging Results for orders placed or performed during the hospital encounter of 06/01/19 (from the past 48 hour(s))  CBG monitoring, ED     Status: None   Collection Time: 06/01/19  4:06 PM  Result Value Ref Range   Glucose-Capillary 83 70 - 99 mg/dL  Comprehensive metabolic panel     Status: Abnormal   Collection Time: 06/01/19  4:16 PM  Result Value Ref Range   Sodium 133 (L) 135 - 145 mmol/L   Potassium 3.6 3.5 - 5.1 mmol/L   Chloride 101 98 - 111 mmol/L   CO2 20 (L) 22 - 32 mmol/L   Glucose, Bld 91 70 - 99 mg/dL   BUN 15 6 - 20 mg/dL   Creatinine, Ser 5.621.27 (H) 0.61 - 1.24 mg/dL   Calcium 8.8 (L) 8.9 - 10.3 mg/dL   Total Protein 6.8 6.5 - 8.1 g/dL   Albumin 4.0 3.5 - 5.0 g/dL   AST 50 (H) 15 - 41 U/L   ALT 35 0 - 44 U/L   Alkaline Phosphatase 74 38 - 126 U/L  Total Bilirubin 1.2 0.3 - 1.2 mg/dL   GFR calc non Af Amer >60 >60 mL/min   GFR calc Af Amer >60 >60 mL/min   Anion gap 12 5 - 15    Comment: Performed at [facility], [address] [place], [place] [address]  CBC with Differential     Status: None   Collection Time: 06/01/19  4:16 PM  Result Value Ref Range   WBC 9.2 4.0 - 10.5 K/uL   RBC 4.45 4.22 - 5.81 MIL/uL   Hemoglobin 13.7 13.0 - 17.0 g/dL   HCT 60.440.3 54.039.0 - 98.152.0 %   MCV 90.6 80.0 - 100.0 fL   MCH 30.8 26.0 - 34.0 pg   MCHC 34.0 30.0 - 36.0 g/dL   RDW 19.112.3 47.811.5 - 29.515.5 %   Platelets 243 150 - 400 K/uL   nRBC 0.0 0.0 - 0.2 %   Neutrophils Relative % 79 %   Neutro Abs 7.3 1.7 - 7.7 K/uL   Lymphocytes Relative 12 %   Lymphs Abs 1.1 0.7 - 4.0 K/uL   Monocytes Relative 7 %   Monocytes Absolute 0.6 0.1 - 1.0 K/uL   Eosinophils Relative 2 %   Eosinophils Absolute 0.2 0.0 - 0.5 K/uL   Basophils Relative 0 %   Basophils Absolute 0.0 0.0 - 0.1 K/uL   Immature Granulocytes 0 %   Abs  Immature Granulocytes 0.03 0.00 - 0.07 K/uL    Comment: Performed at [facility], [address] [place], [place] [address]  Ethanol     Status: None   Collection Time: 06/01/19  4:16 PM  Result Value Ref Range   Alcohol, Ethyl (B) <10 <10 mg/dL    Comment: (NOTE) Lowest detectable limit for serum alcohol is 10 mg/dL. For medical purposes only. Performed at [facility], [address] [place], [place] [address]   Salicylate level     Status: None   Collection Time: 06/01/19  4:16 PM  Result Value Ref Range   Salicylate Lvl <7.0 2.8 - 30.0 mg/dL    Comment: Performed at [facility], [address] [place], [place] [address]  Acetaminophen level     Status: Abnormal   Collection Time: 06/01/19  4:16 PM  Result Value Ref Range   Acetaminophen (Tylenol), Serum <10 (L) 10 - 30 ug/mL    Comment: Performed at [facility], [address] [place], [place] [address]  Magnesium     Status: None   Collection Time: 06/01/19  4:16 PM  Result Value Ref Range   Magnesium 1.9 1.7 - 2.4 mg/dL    Comment: Performed at [facility], [address] [place], [place] [address]  CK     Status: Abnormal   Collection Time: 06/01/19  4:16 PM  Result Value Ref Range   Total CK 1,550 (H) 49 - 397 U/L    Comment: Performed at [facility], [address] [place], [place] [address]   Ct Head Wo Contrast  Result Date: 06/01/2019 CLINICAL DATA:  23 y/o  M; overdose. Altered level of consciousness. EXAM: CT HEAD WITHOUT CONTRAST TECHNIQUE: Contiguous axial images were obtained from the base of the skull through the vertex without intravenous contrast. COMPARISON:  07/13/2018 MRI of the head. FINDINGS: Brain: No evidence of acute infarction, hemorrhage, hydrocephalus, extra-axial collection or mass lesion/mass effect. Vascular: No hyperdense vessel or unexpected calcification. Skull: Left parietal scalp scarring and chronic appearing defect  of outer table of parietal  bone. No acute fracture. Sinuses/Orbits: Moderate frontal and ethmoid sinus mucosal thickening. Mild maxillary and sphenoid sinus mucosal thickening. Normal aeration of mastoid air cells. Debris within the external auditory canals, likely cerumen. Other: None. IMPRESSION: 1. No acute intracranial abnormality. 2. Moderate paranasal sinus disease. Electronically Signed   By: Kristine Garbe M.D.   On: 06/01/2019 19:08   Dg Chest Portable 1 View  Result Date: 06/01/2019 CLINICAL DATA:  Overdose.  Intubation EXAM: PORTABLE CHEST 1 VIEW COMPARISON:  05/28/2019 FINDINGS: Endotracheal tube in good position. Lungs are well aerated and clear. IMPRESSION: Endotracheal tube in good position.  Lungs are clear. Electronically Signed   By: Franchot Gallo M.D.   On: 06/01/2019 18:11    Pending Labs Unresulted Labs (From admission, onward)    Start     Ordered   06/08/19 0500  Creatinine, serum  (enoxaparin (LOVENOX)    CrCl >/= 30 ml/min)  Weekly,   R    Comments: while on enoxaparin therapy    06/01/19 1827   06/02/19 0500  CBC  Tomorrow morning,   R     06/01/19 1827   06/02/19 1448  Basic metabolic panel  Tomorrow morning,   R     06/01/19 1827   06/02/19 0500  Blood gas, arterial  Tomorrow morning,   R     06/01/19 1827   06/02/19 0500  Magnesium  Tomorrow morning,   R     06/01/19 1827   06/02/19 0500  Phosphorus  Tomorrow morning,   R     06/01/19 1827   06/01/19 2000  Blood gas, arterial  Once,   R     06/01/19 1827   06/01/19 1831  SARS Coronavirus 2 (CEPHEID - Performed in [facility] hospital lab), [place]  Once,   STAT    Question:  Rule Out  Answer:  Yes   06/01/19 1830   06/01/19 1829  Triglycerides  (propofol (DIPRIVAN) infusion)  Every 72 hours,   R (with STAT occurrences)    Comments: while on propofol (DIPRIVAN)    06/01/19 1828   06/01/19 1825  HIV antibody (Routine Testing)  Once,   STAT     06/01/19 1827   06/01/19 1825  CBC  (enoxaparin (LOVENOX)    CrCl >/= 30  ml/min)  Once,   STAT    Comments: Baseline for enoxaparin therapy IF NOT ALREADY DRAWN.  Notify MD if PLT < 100 K.    06/01/19 1827   06/01/19 1825  Creatinine, serum  (enoxaparin (LOVENOX)    CrCl >/= 30 ml/min)  Once,   STAT    Comments: Baseline for enoxaparin therapy IF NOT ALREADY DRAWN.    06/01/19 1827   06/01/19 1617  Rapid urine drug screen (hospital performed)  ONCE - STAT,   STAT     06/01/19 1616          Vitals/Pain Today's Vitals   06/01/19 1724 06/01/19 1730 06/01/19 1754 06/01/19 1800  BP:  124/83 (!) 154/89 140/90  Pulse: (!) 52 (!) 55 96 100  Resp: 16 20 19  (!) 26  Temp:      TempSrc:      SpO2: 100% 98% 100% 92%  PainSc:        Isolation Precautions No active isolations  Medications Medications  propofol (DIPRIVAN) 1000 MG/100ML infusion (86.58 mcg/kg/min  77.1 kg Intravenous New Bag/Given 06/01/19 1806)  enoxaparin (LOVENOX) injection 40 mg (has no administration in  time range)  lactated ringers infusion (has no administration in time range)  pantoprazole (PROTONIX) injection 40 mg (has no administration in time range)  naloxone [facility](NARCAN) injection 1 mg (1 mg Intravenous Given 06/01/19 1620)  sodium chloride 0.9 % bolus 1,000 mL (0 mLs Intravenous Stopped 06/01/19 1655)  naloxone (NARCAN) injection 1 mg (1 mg Intravenous Given 06/01/19 1630)  etomidate (AMIDATE) injection 20 mg (20 mg Intravenous Given 06/01/19 1720)  succinylcholine (ANECTINE) injection 150 mg (150 mg Intravenous Given 06/01/19 1721)  propofol (DIPRIVAN) 1000 MG/100ML infusion (  Stopped 06/01/19 1809)  midazolam (VERSED) 2 MG/2ML injection (2 mg  Given 06/01/19 1805)    Mobility walks     Focused Assessments Cardiac Assessment Handoff:  Cardiac Rhythm: Normal sinus rhythm Lab Results  Component Value Date   CKTOTAL 1,550 (H) 06/01/2019   Lab Results  Component Value Date   DDIMER <0.27 12/04/2018   Does the Patient currently have chest pain? No     R Recommendations:  See Admitting Provider Note  Report given to:   Additional Notes: Pt here for OD.

## 2019-06-01 NOTE — Procedures (Signed)
Intubation Procedure Note Dalton Johnson [id number] [birth year]  Procedure: Intubation Indications: Airway protection and maintenance  Procedure Details Consent: Unable to obtain consent because of altered level of consciousness. Time Out: Verified patient identification, verified procedure, site/side was marked, verified correct patient position, special equipment/implants available, medications/allergies/relevent history reviewed, required imaging and test results available.  Performed  Maximum sterile technique was used including cap, gloves, gown, hand hygiene, mask and sheet.  MAC and 4    Evaluation Hemodynamic Status: BP stable throughout; O2 sats: stable throughout Patient's Current Condition: stable Complications: No apparent complications Patient did tolerate procedure well. Chest X-ray ordered to verify placement.  CXR: pending.   Dalton Johnson 06/01/2019

## 2019-06-01 NOTE — Patient Instructions (Signed)
   For bone healing:  Vitamin D3:  5,000 IU daily  Vitamin K2:  100 mcg daily  Magnesium:  200-400 mg daily

## 2019-06-01 NOTE — ED Notes (Signed)
Updated Alex with poison control

## 2019-06-01 NOTE — ED Notes (Signed)
Girlfriend- Minette Brine  [phone]

## 2019-06-02 DIAGNOSIS — T50904D Poisoning by unspecified drugs, medicaments and biological substances, undetermined, subsequent encounter: Secondary | ICD-10-CM

## 2019-06-02 LAB — POCT I-STAT 7, (LYTES, BLD GAS, ICA,H+H)
Acid-base deficit: 1 mmol/L (ref 0.0–2.0)
Bicarbonate: 20.7 mmol/L (ref 20.0–28.0)
Calcium, Ion: 1.2 mmol/L (ref 1.15–1.40)
HCT: 39 % (ref 39.0–52.0)
Hemoglobin: 13.3 g/dL (ref 13.0–17.0)
O2 Saturation: 100 %
Patient temperature: 98.6
Potassium: 3.6 mmol/L (ref 3.5–5.1)
Sodium: 140 mmol/L (ref 135–145)
TCO2: 22 mmol/L (ref 22–32)
pCO2 arterial: 26.2 mmHg — ABNORMAL LOW (ref 32.0–48.0)
pH, Arterial: 7.507 — ABNORMAL HIGH (ref 7.350–7.450)
pO2, Arterial: 245 mmHg — ABNORMAL HIGH (ref 83.0–108.0)

## 2019-06-02 LAB — BASIC METABOLIC PANEL
Anion gap: 11 (ref 5–15)
BUN: 10 mg/dL (ref 6–20)
CO2: 20 mmol/L — ABNORMAL LOW (ref 22–32)
Calcium: 8.6 mg/dL — ABNORMAL LOW (ref 8.9–10.3)
Chloride: 109 mmol/L (ref 98–111)
Creatinine, Ser: 1.13 mg/dL (ref 0.61–1.24)
GFR calc Af Amer: >60 mL/min (ref >60)
GFR calc non Af Amer: >60 mL/min (ref >60)
Glucose, Bld: 88 mg/dL (ref 70–99)
Potassium: 3.6 mmol/L (ref 3.5–5.1)
Sodium: 140 mmol/L (ref 135–145)

## 2019-06-02 LAB — CBC
HCT: 38.1 % — ABNORMAL LOW (ref 39.0–52.0)
Hemoglobin: 13.3 g/dL (ref 13.0–17.0)
MCH: 31.1 pg (ref 26.0–34.0)
MCHC: 34.9 g/dL (ref 30.0–36.0)
MCV: 89 fL (ref 80.0–100.0)
Platelets: 233 10*3/uL (ref 150–400)
RBC: 4.28 MIL/uL (ref 4.22–5.81)
RDW: 12.5 % (ref 11.5–15.5)
WBC: 8.7 10*3/uL (ref 4.0–10.5)
nRBC: 0 % (ref 0.0–0.2)

## 2019-06-02 LAB — PHOSPHORUS: Phosphorus: 2.3 mg/dL — ABNORMAL LOW (ref 2.5–4.6)

## 2019-06-02 LAB — MAGNESIUM: Magnesium: 1.8 mg/dL (ref 1.7–2.4)

## 2019-06-02 MED ORDER — ORAL CARE MOUTH RINSE
15.0000 mL | OROMUCOSAL | Status: DC
  Administered 2019-06-02 (×3): 15 mL via OROMUCOSAL

## 2019-06-02 MED ORDER — CHLORHEXIDINE GLUCONATE CLOTH 2 % EX PADS
6.0000 | MEDICATED_PAD | Freq: Every day | CUTANEOUS | Status: DC
  Administered 2019-06-02: 6 via TOPICAL

## 2019-06-02 MED ORDER — ACETAMINOPHEN 325 MG PO TABS
650.0000 mg | ORAL_TABLET | Freq: Four times a day (QID) | ORAL | Status: DC | PRN
  Administered 2019-06-02: 13:00:00 650 mg via ORAL

## 2019-06-02 MED ORDER — FENTANYL CITRATE (PF) 100 MCG/2ML IJ SOLN
100.0000 ug | Freq: Once | INTRAMUSCULAR | Status: AC
  Administered 2019-06-02: 100 ug via INTRAVENOUS

## 2019-06-02 MED ORDER — CHLORHEXIDINE GLUCONATE 0.12% ORAL RINSE (MEDLINE KIT)
15.0000 mL | Freq: Two times a day (BID) | OROMUCOSAL | Status: DC
  Administered 2019-06-01 – 2019-06-02 (×2): 15 mL via OROMUCOSAL

## 2019-06-02 MED ORDER — PANTOPRAZOLE SODIUM 40 MG PO TBEC: 40.0000 mg | DELAYED_RELEASE_TABLET | Freq: Every day | ORAL | Status: DC

## 2019-06-02 MED ORDER — MIDAZOLAM HCL 2 MG/2ML IJ SOLN
INTRAMUSCULAR | Status: AC
  Filled 2019-06-02: qty 2

## 2019-06-02 MED ORDER — NICOTINE 21 MG/24HR TD PT24
21.0000 mg | MEDICATED_PATCH | Freq: Every day | TRANSDERMAL | Status: DC
  Filled 2019-06-02: qty 1

## 2019-06-02 MED ORDER — MIDAZOLAM HCL 2 MG/2ML IJ SOLN
2.0000 mg | Freq: Once | INTRAMUSCULAR | Status: AC
  Administered 2019-06-01: 18:00:00 2 mg via INTRAVENOUS

## 2019-06-02 MED FILL — Medication: Qty: 1 | Status: AC

## 2019-06-02 NOTE — Progress Notes (Signed)
NAME:  Dalton LoftJulian Gundrum II, MRN:  [id number], DOB:  May 15, 1996, LOS: 1 ADMISSION DATE:  06/01/2019, CONSULTATION DATE:  06/01/2019  REFERRING MD:  Renae FickleEd doc, CHIEF COMPLAINT:  Drug overdose   Brief History   Patient presented to the hospital drowsy and difficulty with arousal Had taken multiple pills of baclofen and Restoril About 19 pills of 10 mg baclofen found to be missing  Past Medical History   Past Medical History:  Diagnosis Date  . Anxiety   . GERD (gastroesophageal reflux disease)   . Hypertension    Patient has been having neck pain, rib cage pain Known fracture of ribs-difficulty sleeping Prescribed baclofen for muscle relaxation and sleep aid  Significant Hospital Events   6/29> Intubated in the emergency department for airway protection  Consults:  PCCM  Procedures:  Endotracheal intubation 06/01/2019  Significant Diagnostic Tests:  SARS-CoV-2 6/29-negative  Urine drug screen 6/29-benzos, THC  CT head 6/29-no acute intracranial abnormality.  Sinus disease.  Chest x-ray 6/29-no acute cardiopulmonary abnormality  EKG 7/29-normal sinus rhythm, QTC 427  Interim history/subjective:  Weaning today morning.  Agitated.  Objective   Blood pressure 116/77, pulse (!) 57, temperature 98.2 F (36.8 C), temperature source Oral, resp. rate 16, weight 70.2 kg, SpO2 100 %.    Vent Mode: CPAP;PSV FiO2 (%):  [30 %-100 %] 30 % Set Rate:  [16 bmp] 16 bmp Vt Set:  [650 mL] 650 mL PEEP:  [5 cmH20] 5 cmH20 Pressure Support:  [5 cmH20] 5 cmH20 Plateau Pressure:  [12 cmH20-15 cmH20] 13 cmH20   Intake/Output Summary (Last 24 hours) at 06/02/2019 0825 Last data filed at 06/02/2019 0600 Gross per 24 hour  Intake 1623.99 ml  Output 995 ml  Net 628.99 ml   Filed Weights   06/01/19 2135 06/02/19 0500  Weight: 70.1 kg 70.2 kg   Examination:. Gen:      No acute distress HEENT:  EOMI, sclera anicteric Neck:     No masses; no thyromegaly, ETT Lungs:    Clear to auscultation  bilaterally; normal respiratory effort CV:         Regular rate and rhythm; no murmurs Abd:      + bowel sounds; soft, non-tender; no palpable masses, no distension Ext:    No edema; adequate peripheral perfusion Skin:      Warm and dry; no rash Neuro: Agitated, moving all extremities.  Wants to board.  Resolved Hospital Problem list    Assessment & Plan:  Drug overdose History of polysubstance abuse Recent prescription for baclofen and Restoril Multiple pills missing Need psychiatry consult after extubation Poison control was contacted Risk with baclofen as its very long-acting Supportive measures  Acute respiratory failure Support weans. Plan on extubation today   Best practice:  Diet: NPO Pain/Anxiety/Delirium protocol (if indicated): Propofol VAP protocol (if indicated): In place DVT prophylaxis: Lovenox, SCD GI prophylaxis: Protonix Mobility: Bedrest Code Status: Full code Disposition: Intensive care unit  Labs   CBC: Recent Labs  Lab 06/01/19 1616 06/01/19 1909 06/01/19 1911 06/02/19 0231 06/02/19 0403  WBC 9.2 7.8  --   --  8.7  NEUTROABS 7.3  --   --   --   --   HGB 13.7 14.5 12.6* 13.3 13.3  HCT 40.3 39.8 37.0* 39.0 38.1*  MCV 90.6 90.7  --   --  89.0  PLT 243 269  --   --  233    Basic Metabolic Panel: Recent Labs  Lab 06/01/19 1616 06/01/19 1909 06/01/19 1911  06/02/19 0231 06/02/19 0403  NA 133*  --  139 140 140  K 3.6  --  4.1 3.6 3.6  CL 101  --   --   --  109  CO2 20*  --   --   --  20*  GLUCOSE 91  --   --   --  88  BUN 15  --   --   --  10  CREATININE 1.27* 1.15  --   --  1.13  CALCIUM 8.8*  --   --   --  8.6*  MG 1.9  --   --   --  1.8  PHOS  --   --   --   --  2.3*   GFR: Estimated Creatinine Clearance: 100.1 mL/min (by C-G formula based on SCr of 1.13 mg/dL). Recent Labs  Lab 06/01/19 1616 06/01/19 1909 06/02/19 0403  WBC 9.2 7.8 8.7    Liver Function Tests: Recent Labs  Lab 06/01/19 1616  AST 50*  ALT 35   ALKPHOS 74  BILITOT 1.2  PROT 6.8  ALBUMIN 4.0   No results for input(s): LIPASE, AMYLASE in the last 168 hours. No results for input(s): AMMONIA in the last 168 hours.  ABG    Component Value Date/Time   PHART 7.507 (H) 06/02/2019 0231   PCO2ART 26.2 (L) 06/02/2019 0231   PO2ART 245.0 (H) 06/02/2019 0231   HCO3 20.7 06/02/2019 0231   TCO2 22 06/02/2019 0231   ACIDBASEDEF 1.0 06/02/2019 0231   O2SAT 100.0 06/02/2019 0231     Coagulation Profile: No results for input(s): INR, PROTIME in the last 168 hours.  Cardiac Enzymes: Recent Labs  Lab 06/01/19 1616  CKTOTAL 1,550*    HbA1C: No results found for: HGBA1C  CBG: Recent Labs  Lab 06/01/19 1606  GLUCAP 83   The patient is critically ill with multiple organ system failure and requires high complexity decision making for assessment and support, frequent evaluation and titration of therapies, advanced monitoring, review of radiographic studies and interpretation of complex data.   Critical Care Time devoted to patient care services, exclusive of separately billable procedures, described in this note is 35 minutes.   Marshell Garfinkel MD [facility] Pager [phone] If no answer call [phone] 06/02/2019, 8:26 AM

## 2019-06-02 NOTE — Procedures (Signed)
Extubation Procedure Note  Patient Details:   Name: Dalton Johnson DOB: 1996/07/15 MRN: [id number]   Airway Documentation:    Vent end date: 06/02/19 Vent end time: 0839   Evaluation  O2 sats: stable throughout Complications: No apparent complications Patient did tolerate procedure well. Bilateral Breath Sounds: Clear   Yes  Pt extubated to 2L N/C.  No stridor noted RN @ bedside.  Donnetta Hail 06/02/2019, 8:40 AM

## 2019-06-02 NOTE — Consult Note (Signed)
**Note Dalton-Identified via Obfuscation** Telepsych Consultation   Reason for Consult:  Drug overdose  Referring Physician:  Dr. Marshell Garfinkel Location of Patient: [id number] Location of Provider: [facility]  Patient Identification: Dalton Johnson MRN:  [id number] Principal Diagnosis: Overdose Diagnosis:  Principal Problem:   Overdose   Total Time spent with patient: 1 hour  Subjective:   Dalton Johnson is a 23 y.o. male patient admitted with drug overdose.  HPI:   Per chart review, patient was admitted with drug overdose of multiple pills of Baclofen and Restoril. There was reportedly 19 pills of Baclofen 10 mg missing from the bottle filled one day prior to hospitalization. He was also missing an entire bottle of Restoril which was recently filled on 6/26. He required intubation for acute respiratory failure. He has a history of polysubstance abuse. BAL was positive for benzodiazepines and THC on admission.   Of note, patient was last seen by the psychiatry service on 2/21 for heroin overdose which was thought to be unintentional. He was psychiatrically cleared. He was started on Gabapentin for alcohol use/anxiety.   On interview, Dalton Johnson reports that he recently broke his rib in the shower. He admits to misusing his Restoril. He takes 2-3 tablets nightly for insomnia and anxiety. He reports that he sometimes runs out of it early. He also reports that a friend took 8 tablets out of the bottle. He reports that he took 12 tablets of Baclofen at once after it was prescribed for the first time. He reports that he was unaware of the risks of excessive use because he was prescribed "a small dose." He reports that he is not trying to harm himself and states, "I am trying to heal myself. My doctor gave me vitamins to help with healing my bones." He denies mood symptoms. He has been "pretty happy." He denies SI, HI or AVH. He denies a history of suicide attempts. He denies recent illicit substance use except  marijuana use. He provides verbal consent to speak to his fiance, Charleen Kirks [phone]  Patient's fiance, Minette Brine was contacted by phone. She reports that he has been doing well. She reports that she does not have any concerns that the patient intentionally tried to harm himself. She does believe that he was misusing his medications and has reached out to his prescriber about this concern. She agrees to safely store the medication in a lockbox.     Past Psychiatric History: Polysubstance abuse (opiates, benzodiazepines, marijuana and alcohol)  Risk to Self:  None. Denies SI.  Risk to Others:  None. Denies HI.  Prior Inpatient Therapy:   He completed rehab for alcohol abuse a year ago. He is currently attending an IOP for substance abuse.  Prior Outpatient Therapy:  He is followed by Lorelle Gibbs, NP.   Past Medical History:  Past Medical History:  Diagnosis Date  . Anxiety   . GERD (gastroesophageal reflux disease)   . Hypertension     Past Surgical History:  Procedure Laterality Date  . HAND SURGERY    . TONSILLECTOMY     Family History: No family history on file. Family Psychiatric  History: Maternal aunt-schizoaffective disorder  Social History:  Social History   Substance and Sexual Activity  Alcohol Use Yes  . Alcohol/week: 5.0 standard drinks  . Types: 5 Cans of beer per week     Social History   Substance and Sexual Activity  Drug Use Not Currently    Social History   Socioeconomic History  .  Marital status: Single    Spouse name: Not on file  . Number of children: Not on file  . Years of education: Not on file  . Highest education level: Not on file  Occupational History  . Not on file  Social Needs  . Financial resource strain: Not on file  . Food insecurity    Worry: Not on file    Inability: Not on file  . Transportation needs    Medical: Not on file    Non-medical: Not on file  Tobacco Use  . Smoking status: Current Every Day Smoker     Packs/day: 1.00    Years: 5.00    Pack years: 5.00    Types: Cigarettes  . Smokeless tobacco: Never Used  Substance and Sexual Activity  . Alcohol use: Yes    Alcohol/week: 5.0 standard drinks    Types: 5 Cans of beer per week  . Drug use: Not Currently  . Sexual activity: Yes    Birth control/protection: None  Lifestyle  . Physical activity    Days per week: Not on file    Minutes per session: Not on file  . Stress: Not on file  Relationships  . Social Herbalist on phone: Not on file    Gets together: Not on file    Attends religious service: Not on file    Active member of club or organization: Not on file    Attends meetings of clubs or organizations: Not on file    Relationship status: Not on file  Other Topics Concern  . Not on file  Social History Narrative  . Not on file   Additional Social History: He lives in an apartment with his fiance. He is a Training and development officer at [employer]. He reports social alcohol use and marijuana use.     Allergies:  No Known Allergies  Labs:  Results for orders placed or performed during the hospital encounter of 06/01/19 (from the past 48 hour(s))  CBG monitoring, ED     Status: None   Collection Time: 06/01/19  4:06 PM  Result Value Ref Range   Glucose-Capillary 83 70 - 99 mg/dL  Comprehensive metabolic panel     Status: Abnormal   Collection Time: 06/01/19  4:16 PM  Result Value Ref Range   Sodium 133 (L) 135 - 145 mmol/L   Potassium 3.6 3.5 - 5.1 mmol/L   Chloride 101 98 - 111 mmol/L   CO2 20 (L) 22 - 32 mmol/L   Glucose, Bld 91 70 - 99 mg/dL   BUN 15 6 - 20 mg/dL   Creatinine, Ser 1.27 (H) 0.61 - 1.24 mg/dL   Calcium 8.8 (L) 8.9 - 10.3 mg/dL   Total Protein 6.8 6.5 - 8.1 g/dL   Albumin 4.0 3.5 - 5.0 g/dL   AST 50 (H) 15 - 41 U/L   ALT 35 0 - 44 U/L   Alkaline Phosphatase 74 38 - 126 U/L   Total Bilirubin 1.2 0.3 - 1.2 mg/dL   GFR calc non Af Amer >60 >60 mL/min   GFR calc Af Amer >60 >60 mL/min   Anion gap 12 5  - 15    Comment: Performed at [facility], [address], [place], [place] [address]  CBC with Differential     Status: None   Collection Time: 06/01/19  4:16 PM  Result Value Ref Range   WBC 9.2 4.0 - 10.5 K/uL   RBC 4.45 4.22 -  5.81 MIL/uL   Hemoglobin 13.7 13.0 - 17.0 g/dL   HCT 40.3 39.0 - 52.0 %   MCV 90.6 80.0 - 100.0 fL   MCH 30.8 26.0 - 34.0 pg   MCHC 34.0 30.0 - 36.0 g/dL   RDW 12.3 11.5 - 15.5 %   Platelets 243 150 - 400 K/uL   nRBC 0.0 0.0 - 0.2 %   Neutrophils Relative % 79 %   Neutro Abs 7.3 1.7 - 7.7 K/uL   Lymphocytes Relative 12 %   Lymphs Abs 1.1 0.7 - 4.0 K/uL   Monocytes Relative 7 %   Monocytes Absolute 0.6 0.1 - 1.0 K/uL   Eosinophils Relative 2 %   Eosinophils Absolute 0.2 0.0 - 0.5 K/uL   Basophils Relative 0 %   Basophils Absolute 0.0 0.0 - 0.1 K/uL   Immature Granulocytes 0 %   Abs Immature Granulocytes 0.03 0.00 - 0.07 K/uL    Comment: Performed at [facility] [address] [place], [place] [address]  Ethanol     Status: None   Collection Time: 06/01/19  4:16 PM  Result Value Ref Range   Alcohol, Ethyl (B) <10 <10 mg/dL    Comment: (NOTE) Lowest detectable limit for serum alcohol is 10 mg/dL. For medical purposes only. Performed at [facility], [place] [address], [place], [place] [address]   Salicylate level     Status: None   Collection Time: 06/01/19  4:16 PM  Result Value Ref Range   Salicylate Lvl <1.5 2.8 - 30.0 mg/dL    Comment: Performed at [facility] [address], [place], [place] [address]  Acetaminophen level     Status: Abnormal   Collection Time: 06/01/19  4:16 PM  Result Value Ref Range   Acetaminophen (Tylenol), Serum <10 (L) 10 - 30 ug/mL    Comment: Performed at [facility] [address] [place], [place] [address]  Magnesium     Status: None   Collection Time: 06/01/19  4:16 PM  Result Value Ref Range   Magnesium 1.9 1.7 - 2.4 mg/dL    Comment: Performed at [facility],  [place] [address], [place], [place] [address]  CK     Status: Abnormal   Collection Time: 06/01/19  4:16 PM  Result Value Ref Range   Total CK 1,550 (H) 49 - 397 U/L    Comment: Performed at [facility], [place] [address], [place], [place] [address]  CBC     Status: Abnormal   Collection Time: 06/01/19  7:09 PM  Result Value Ref Range   WBC 7.8 4.0 - 10.5 K/uL   RBC 4.39 4.22 - 5.81 MIL/uL   Hemoglobin 14.5 13.0 - 17.0 g/dL   HCT 39.8 39.0 - 52.0 %   MCV 90.7 80.0 - 100.0 fL   MCH 33.0 26.0 - 34.0 pg   MCHC 36.4 (H) 30.0 - 36.0 g/dL   RDW 12.3 11.5 - 15.5 %   Platelets 269 150 - 400 K/uL   nRBC 0.0 0.0 - 0.2 %    Comment: Performed at [facility], [place] [address], [place],  [address]  Creatinine, serum     Status: None   Collection Time: 06/01/19  7:09 PM  Result Value Ref Range   Creatinine, Ser 1.15 0.61 - 1.24 mg/dL   GFR calc non Af Amer >60 >60 mL/min   GFR calc Af Amer >60 >60 mL/min  Comment: Performed at [facility], [place] [address] [place], [place] [address]  Triglycerides     Status: Abnormal   Collection Time: 06/01/19  7:09 PM  Result Value Ref Range   Triglycerides 1,740 (H) <150 mg/dL    Comment: RESULTS CONFIRMED BY MANUAL DILUTION LIPEMIC SPECIMEN Performed at [facility], [place] [address] [place], [place] [address]   I-STAT 7, (LYTES, BLD GAS, ICA, H+H)     Status: Abnormal   Collection Time: 06/01/19  7:11 PM  Result Value Ref Range   pH, Arterial 7.380 7.350 - 7.450   pCO2 arterial 34.8 32.0 - 48.0 mmHg   pO2, Arterial 82.0 (L) 83.0 - 108.0 mmHg   Bicarbonate 20.6 20.0 - 28.0 mmol/L   TCO2 22 22 - 32 mmol/L   O2 Saturation 96.0 %   Acid-base deficit 4.0 (H) 0.0 - 2.0 mmol/L   Sodium 139 135 - 145 mmol/L   Potassium 4.1 3.5 - 5.1 mmol/L   Calcium, Ion 1.20 1.15 - 1.40 mmol/L   HCT 37.0 (L) 39.0 - 52.0 %   Hemoglobin 12.6 (L) 13.0 - 17.0 g/dL   Patient temperature 98.6 F    Collection site RADIAL, ALLEN'S TEST ACCEPTABLE     Sample type ARTERIAL   SARS Coronavirus 2 (CEPHEID - Performed in [facility] hospital lab), Hosp Order     Status: None   Collection Time: 06/01/19  7:20 PM   Specimen: Nasopharyngeal Swab  Result Value Ref Range   SARS Coronavirus 2 NEGATIVE NEGATIVE    Comment: (NOTE) If result is NEGATIVE SARS-CoV-2 target nucleic acids are NOT DETECTED. The SARS-CoV-2 RNA is generally detectable in upper and lower  respiratory specimens during the acute phase of infection. The lowest  concentration of SARS-CoV-2 viral copies this assay can detect is 250  copies / mL. A negative result does not preclude SARS-CoV-2 infection  and should not be used as the sole basis for treatment or other  patient management decisions.  A negative result may occur with  improper specimen collection / handling, submission of specimen other  than nasopharyngeal swab, presence of viral mutation(s) within the  areas targeted by this assay, and inadequate number of viral copies  (<250 copies / mL). A negative result must be combined with clinical  observations, patient history, and epidemiological information. If result is POSITIVE SARS-CoV-2 target nucleic acids are DETECTED. The SARS-CoV-2 RNA is generally detectable in upper and lower  respiratory specimens dur ing the acute phase of infection.  Positive  results are indicative of active infection with SARS-CoV-2.  Clinical  correlation with patient history and other diagnostic information is  necessary to determine patient infection status.  Positive results do  not rule out bacterial infection or co-infection with other viruses. If result is PRESUMPTIVE POSTIVE SARS-CoV-2 nucleic acids MAY BE PRESENT.   A presumptive positive result was obtained on the submitted specimen  and confirmed on repeat testing.  While 2019 novel coronavirus  (SARS-CoV-2) nucleic acids may be present in the submitted sample  additional confirmatory testing may be necessary for  epidemiological  and / or clinical management purposes  to differentiate between  SARS-CoV-2 and other Sarbecovirus currently known to infect humans.  If clinically indicated additional testing with an alternate test  methodology [phone]) is advised. The SARS-CoV-2 RNA is generally  detectable in upper and lower respiratory sp ecimens during the acute  phase of infection. The expected result is Negative. Fact Sheet for Patients:  StrictlyIdeas.no  Fact Sheet for Healthcare Providers: BankingDealers.co.za This test is not yet approved or cleared by the [place] FDA and has been authorized for detection and/or diagnosis of SARS-CoV-2 by FDA under an Emergency Use Authorization (EUA).  This EUA will remain in effect (meaning this test can be used) for the duration of the COVID-19 declaration under Section 564(b)(1) of the Act, 21 U.S.C. section 360bbb-3(b)(1), unless the authorization is terminated or revoked sooner. Performed at [facility], [place] [address] [place], [place] [address]   MRSA PCR Screening     Status: None   Collection Time: 06/01/19  9:36 PM   Specimen: Nasopharyngeal  Result Value Ref Range   MRSA by PCR NEGATIVE NEGATIVE    Comment:        The GeneXpert MRSA Assay (FDA approved for NASAL specimens only), is one component of a comprehensive MRSA colonization surveillance program. It is not intended to diagnose MRSA infection nor to guide or monitor treatment for MRSA infections. Performed at [facility], [place] [address] [place], [place] [address]   Rapid urine drug screen (hospital performed)     Status: Abnormal   Collection Time: 06/01/19 11:03 PM  Result Value Ref Range   Opiates NONE DETECTED NONE DETECTED   Cocaine NONE DETECTED NONE DETECTED   Benzodiazepines POSITIVE (A) NONE DETECTED   Amphetamines NONE DETECTED NONE DETECTED   Tetrahydrocannabinol POSITIVE (A) NONE DETECTED    Barbiturates NONE DETECTED NONE DETECTED    Comment: (NOTE) DRUG SCREEN FOR MEDICAL PURPOSES ONLY.  IF CONFIRMATION IS NEEDED FOR ANY PURPOSE, NOTIFY LAB WITHIN 5 DAYS. LOWEST DETECTABLE LIMITS FOR URINE DRUG SCREEN Drug Class                     Cutoff (ng/mL) Amphetamine and metabolites    1000 Barbiturate and metabolites    200 Benzodiazepine                 338 Tricyclics and metabolites     300 Opiates and metabolites        300 Cocaine and metabolites        300 THC                            50 Performed at [facility], [place] [address], [place], [place] [address]   I-STAT 7, (LYTES, BLD GAS, ICA, H+H)     Status: Abnormal   Collection Time: 06/02/19  2:31 AM  Result Value Ref Range   pH, Arterial 7.507 (H) 7.350 - 7.450   pCO2 arterial 26.2 (L) 32.0 - 48.0 mmHg   pO2, Arterial 245.0 (H) 83.0 - 108.0 mmHg   Bicarbonate 20.7 20.0 - 28.0 mmol/L   TCO2 22 22 - 32 mmol/L   O2 Saturation 100.0 %   Acid-base deficit 1.0 0.0 - 2.0 mmol/L   Sodium 140 135 - 145 mmol/L   Potassium 3.6 3.5 - 5.1 mmol/L   Calcium, Ion 1.20 1.15 - 1.40 mmol/L   HCT 39.0 39.0 - 52.0 %   Hemoglobin 13.3 13.0 - 17.0 g/dL   Patient temperature 98.6 F    Collection site RADIAL, ALLEN'S TEST ACCEPTABLE    Drawn by RT    Sample type ARTERIAL   CBC     Status: Abnormal   Collection Time: 06/02/19  4:03 AM  Result Value Ref Range   WBC 8.7 4.0 - 10.5 K/uL  RBC 4.28 4.22 - 5.81 MIL/uL   Hemoglobin 13.3 13.0 - 17.0 g/dL   HCT 38.1 (L) 39.0 - 52.0 %   MCV 89.0 80.0 - 100.0 fL   MCH 31.1 26.0 - 34.0 pg   MCHC 34.9 30.0 - 36.0 g/dL   RDW 12.5 11.5 - 15.5 %   Platelets 233 150 - 400 K/uL   nRBC 0.0 0.0 - 0.2 %    Comment: Performed at [facility] [address] [place], [place] [address]  Basic metabolic panel     Status: Abnormal   Collection Time: 06/02/19  4:03 AM  Result Value Ref Range   Sodium 140 135 - 145 mmol/L   Potassium 3.6 3.5 - 5.1 mmol/L   Chloride 109 98 - 111  mmol/L   CO2 20 (L) 22 - 32 mmol/L   Glucose, Bld 88 70 - 99 mg/dL   BUN 10 6 - 20 mg/dL   Creatinine, Ser 1.13 0.61 - 1.24 mg/dL   Calcium 8.6 (L) 8.9 - 10.3 mg/dL   GFR calc non Af Amer >60 >60 mL/min   GFR calc Af Amer >60 >60 mL/min   Anion gap 11 5 - 15    Comment: Performed at [facility], [place] [address] [place], [place] [address]  Magnesium     Status: None   Collection Time: 06/02/19  4:03 AM  Result Value Ref Range   Magnesium 1.8 1.7 - 2.4 mg/dL    Comment: Performed at [facility] [address], [place], [place] [address]  Phosphorus     Status: Abnormal   Collection Time: 06/02/19  4:03 AM  Result Value Ref Range   Phosphorus 2.3 (L) 2.5 - 4.6 mg/dL    Comment: Performed at [facility] [address] [place], [place] [address]    Medications:  Current Facility-Administered Medications  Medication Dose Route Frequency Provider Last Rate Last Dose  . acetaminophen (TYLENOL) tablet 650 mg  650 mg Oral Q6H PRN Mannam, Praveen, MD   650 mg at 06/02/19 1312  . chlorhexidine gluconate (MEDLINE KIT) (PERIDEX) 0.12 % solution 15 mL  15 mL Mouth Rinse BID Olalere, Adewale A, MD   15 mL at 06/02/19 1312  . Chlorhexidine Gluconate Cloth 2 % PADS 6 each  6 each Topical Daily Mannam, Praveen, MD      . enoxaparin (LOVENOX) injection 40 mg  40 mg Subcutaneous Q24H Olalere, Adewale A, MD      . lactated ringers infusion   Intravenous Continuous Olalere, Adewale A, MD 100 mL/hr at 06/02/19 0800    . nicotine (NICODERM CQ - dosed in mg/24 hours) patch 21 mg  21 mg Transdermal Daily Mannam, Praveen, MD      . pantoprazole (PROTONIX) injection 40 mg  40 mg Intravenous QHS Olalere, Adewale A, MD   40 mg at 06/01/19 2136  . propofol (DIPRIVAN) 1000 MG/100ML infusion  5-80 mcg/kg/min Intravenous Titrated Nat Christen, MD 10.5 mL/hr at 06/02/19 0800 25 mcg/kg/min at 06/02/19 0800    Musculoskeletal: Strength & Muscle Tone: No atrophy noted. Gait & Station: UTA since  patient is lying in bed. Patient leans: N/A  Psychiatric Specialty Exam: Physical Exam  Nursing note and vitals reviewed. Constitutional: He is oriented to person, place, and time. He appears well-developed and well-nourished.  HENT:  Head: Normocephalic and atraumatic.  Neck: Normal range of motion.  Respiratory: Effort normal.  Musculoskeletal: Normal range of motion.  Neurological: He is  alert and oriented to person, place, and time.  Psychiatric: He has a normal mood and affect. His speech is normal and behavior is normal. Judgment and thought content normal. Cognition and memory are normal.    Review of Systems  Musculoskeletal:       Right sided rib pain.  Psychiatric/Behavioral: Positive for substance abuse. Negative for depression, hallucinations and suicidal ideas.  All other systems reviewed and are negative.   Blood pressure 136/87, pulse (!) 59, temperature 98.3 F (36.8 C), temperature source Oral, resp. rate (!) 21, weight 70.2 kg, SpO2 100 %.Body mass index is 19.87 kg/m.  General Appearance: Disheveled, young, Caucasian male with dry, peeling lips who is lying in bed. NAD.   Eye Contact:  Good  Speech:  Clear and Coherent and Normal Rate  Volume:  Normal  Mood:  Euthymic  Affect:  Appropriate and Congruent  Thought Process:  Goal Directed, Linear and Descriptions of Associations: Intact  Orientation:  Full (Time, Place, and Person)  Thought Content:  Logical  Suicidal Thoughts:  No  Homicidal Thoughts:  No  Memory:  Immediate;   Good Recent;   Good Remote;   Good  Judgement:  Poor  Insight:  Fair  Psychomotor Activity:  Normal  Concentration:  Concentration: Good and Attention Span: Good  Recall:  Good  Fund of Knowledge:  Good  Language:  Good  Akathisia:  No  Handed:  Right  AIMS (if indicated):   N/A  Assets:  Communication Skills Desire for Improvement Financial Resources/Insurance Housing Physical Health Resilience Social Support  ADL's:   Intact  Cognition:  WNL  Sleep:   N/A   Assessment:  Dalton Johnson is a 23 y.o. male who was admitted with drug overdose of multiple pills of Baclofen and Restoril. Patient reports an unintentional overdose of his medications due to self-medicating for rib pain. He denies mood symptoms and is future oriented. He denies SI, HI or AVH. Collateral was obtained from his fiance who denies concerns that he tried to intentionally harm himself. She agrees to properly store medications in a lockbox since patient has been misusing them and letting friends have his medication. Patient is psychiatrically cleared and safe for discharge.    Treatment Plan Summary: -Continue CDIOP.  -EKG reviewed and QTc 427 on 6/27. Please closely monitor when starting or increasing QTc prolonging agents.    Disposition: No evidence of imminent risk to self or others at present.   Patient does not meet criteria for psychiatric inpatient admission.  This service was provided via telemedicine using a 2-way, interactive audio and video technology.  Names of all persons participating in this telemedicine service and their role in this encounter. Name: Buford Dresser, DO Role: Psychiatrist  Name: Dalton Johnson  Role: Patient    Faythe Dingwall, DO 06/02/2019 2:21 PM

## 2019-06-02 NOTE — Discharge Summary (Addendum)
Physician Discharge Summary  Patient ID: Dalton Johnson MRN: [id number] DOB/AGE: 04/20/1996 23 y.o.  Admit date: 06/01/2019 Discharge date: 06/02/2019    Discharge Diagnoses:  Overdose with Baclofen and Restoril  HTN Anxiety  GERD                                                     DISCHARGE PLAN BY DIAGNOSIS     Overdose with Baclofen and Restoril  Discharge Plan: -Psychiatry Consulted and cleared patient for discharge  -Continue to attend IOP  HTN Discharge Plan: -Resume Lisinopril   Anxiety  Discharge Plan: -Stop taking baclofen and Restoril  -Continue Gabapentin and Paxil   GERD Discharge Plan: -Continue Protonix                      DISCHARGE SUMMARY  23 year old male presents to ED on 6/29 unresponsive requiring intubation secondary to overdose presumed Baclofen and Restoril. CT head negative for acute process. UDS positive for THC and Benzo. Extubated 6/30. Psychiatry consulted. Medically cleared for discharge. Educated patient on safety with medications.           SIGNIFICANT DIAGNOSTIC STUDIES CXR 6/29 > no acute process, active disease CT Head 6/29 > No acute intracranial abnormality. Moderate paranasal sinus disease  Discharge Exam: General: Young adult male, sitting in bed, no distress  Neuro: alert, oriented, follows commands  CV: RRR, no MRG PULM: Clear breath sounds, no wheeze/crackles  GI: intact  Extremities: -edema   Vitals:   06/02/19 0900 06/02/19 1000 06/02/19 1100 06/02/19 1148  BP: (!) 130/99 136/87    Pulse: 70 (!) 57 (!) 59   Resp: (!) 22 16 (!) 21   Temp:    98.3 F (36.8 C)  TempSrc:    Oral  SpO2: 98% 99% 100%   Weight:         Discharge Labs  BMET Recent Labs  Lab 06/01/19 1616 06/01/19 1909 06/01/19 1911 06/02/19 0231 06/02/19 0403  NA 133*  --  139 140 140  K 3.6  --  4.1 3.6 3.6  CL 101  --   --   --  109  CO2 20*  --   --   --  20*  GLUCOSE 91  --   --   --  88  BUN 15  --   --   --  10  CREATININE  1.27* 1.15  --   --  1.13  CALCIUM 8.8*  --   --   --  8.6*  MG 1.9  --   --   --  1.8  PHOS  --   --   --   --  2.3*    CBC Recent Labs  Lab 06/01/19 1616 06/01/19 1909 06/01/19 1911 06/02/19 0231 06/02/19 0403  HGB 13.7 14.5 12.6* 13.3 13.3  HCT 40.3 39.8 37.0* 39.0 38.1*  WBC 9.2 7.8  --   --  8.7  PLT 243 269  --   --  233    Anti-Coagulation No results for input(s): INR in the last 168 hours.  Discharge Instructions    Diet - low sodium heart healthy   Complete by: As directed    Increase activity slowly   Complete by: As directed  Allergies as of 06/02/2019   No Known Allergies     Medication List    STOP taking these medications   baclofen 10 MG tablet Commonly known as: LIORESAL   hydrOXYzine 25 MG tablet Commonly known as: ATARAX/VISTARIL   temazepam 30 MG capsule Commonly known as: RESTORIL   traMADol 50 MG tablet Commonly known as: ULTRAM     TAKE these medications   gabapentin 300 MG capsule Commonly known as: NEURONTIN Take 300 mg by mouth See admin instructions. Take '300mg'$  three times daily and '600mg'$  at bedtime   lisinopril 20 MG tablet Commonly known as: ZESTRIL Take 20 mg by mouth daily.   Magnesium 400 MG Caps Take 400 mg by mouth daily.   pantoprazole 40 MG tablet Commonly known as: PROTONIX Take 40 mg by mouth daily.   PARoxetine 30 MG tablet Commonly known as: PAXIL Take 30 mg by mouth daily.   Vitamin D-3 125 MCG (5000 UT) Tabs Take 1 tablet by mouth daily.   Vitamin K2 100 MCG Caps Take 100 mcg by mouth daily.        Disposition: Discharge home   Discharged Condition: Dalton Johnson has met maximum benefit of inpatient care and is medically stable and cleared for discharge.  Patient is pending follow up as above.      Time spent on disposition:  32 Minutes.   Signed:  Hayden Pedro, AGACNP-BC [facility]  PCCM Pgr: [phone]

## 2019-06-03 LAB — HIV ANTIBODY (ROUTINE TESTING W REFLEX): HIV Screen 4th Generation wRfx: NONREACTIVE

## 2019-06-18 ENCOUNTER — Other Ambulatory Visit: Payer: Self-pay

## 2019-06-18 ENCOUNTER — Encounter: Payer: Self-pay | Admitting: Orthopaedic Surgery

## 2019-06-18 ENCOUNTER — Ambulatory Visit (INDEPENDENT_AMBULATORY_CARE_PROVIDER_SITE_OTHER): Payer: Commercial Managed Care - PPO | Admitting: Orthopaedic Surgery

## 2019-06-18 DIAGNOSIS — S2231XD Fracture of one rib, right side, subsequent encounter for fracture with routine healing: Secondary | ICD-10-CM

## 2019-06-18 NOTE — Progress Notes (Signed)
Office Visit Note   Patient: Dalton Johnson           Date of Birth: 1996/08/03           MRN: [id number] Visit Date: 06/18/2019              Requested by: No referring provider defined for this encounter. PCP: Patient, No Pcp Per   Assessment & Plan: Visit Diagnoses:  1. Closed fracture of one rib of right side with routine healing, subsequent encounter     Plan: Impression is 3 weeks status post right sided ninth rib fracture.  Patient will continue symptomatic treatment.  He will continue using his incentive spirometer.  We have provided him a new work note to include no lifting greater than 10 pounds.  He will follow-up with [place] in 4 weeks time for repeat evaluation.  Call with concerns or questions in the meantime.  Follow-Up Instructions: Return in about 4 weeks (around 07/16/2019).   Orders:  No orders of the defined types were placed in this encounter.  No orders of the defined types were placed in this encounter.     Procedures: No procedures performed   Clinical Data: No additional findings.   Subjective: Chief Complaint  Patient presents with  . Chest - Follow-up    HPI patient is a pleasant 24 year old who comes in today for follow-up of his right rib fracture.  He is approximately 3 weeks out from this.  Mildly displaced ninth rib fracture on the right.  He has been wearing a brace which has provided minimal relief of symptoms.  He denies any new shortness of breath or chest pain.  He has been using his incentive spirometer.  He tried to return to work at the [employer] where he has to often lift heavy equipment and this is proved to cause sharp pains to the rib fracture.    Review of Systems as detailed in HPI.  All others reviewed and are negative.   Objective: Vital Signs: There were no vitals taken for this visit.  Physical Exam well-developed and well-nourished gentleman in no acute distress.  Alert and oriented x3.  Ortho Exam  examination of the right rib reveals moderate tenderness to palpation over the fracture.  Specialty Comments:  No specialty comments available.  Imaging: No new imaging   PMFS History: Patient Active Problem List   Diagnosis Date Noted  . Overdose 06/01/2019  . Right rib fracture 05/29/2019  . Heroin overdose ([place]) 01/23/2019  . Rash of hands 01/23/2019  . Essential hypertension 01/23/2019  . Cellulitis 01/23/2019  . Polysubstance abuse [facility])    Past Medical History:  Diagnosis Date  . Anxiety   . GERD (gastroesophageal reflux disease)   . Hypertension     History reviewed. No pertinent family history.  Past Surgical History:  Procedure Laterality Date  . HAND SURGERY    . TONSILLECTOMY     Social History   Occupational History  . Not on file  Tobacco Use  . Smoking status: Current Every Day Smoker    Packs/day: 1.00    Years: 5.00    Pack years: 5.00    Types: Cigarettes  . Smokeless tobacco: Never Used  Substance and Sexual Activity  . Alcohol use: Yes    Alcohol/week: 5.0 standard drinks    Types: 5 Cans of beer per week  . Drug use: Not Currently  . Sexual activity: Yes    Birth control/protection: None

## 2019-06-30 ENCOUNTER — Other Ambulatory Visit: Payer: Self-pay

## 2019-06-30 ENCOUNTER — Encounter (HOSPITAL_COMMUNITY): Payer: Self-pay | Admitting: Emergency Medicine

## 2019-06-30 ENCOUNTER — Emergency Department (HOSPITAL_COMMUNITY)
Admission: EM | Admit: 2019-06-30 | Discharge: 2019-06-30 | Disposition: A | Discharge status: Home / Self Care | Payer: Commercial Managed Care - PPO | Attending: Emergency Medicine | Admitting: Emergency Medicine

## 2019-06-30 ENCOUNTER — Emergency Department (HOSPITAL_COMMUNITY): Payer: Commercial Managed Care - PPO

## 2019-06-30 DIAGNOSIS — I10 Essential (primary) hypertension: Secondary | ICD-10-CM | POA: Insufficient documentation

## 2019-06-30 DIAGNOSIS — R0789 Other chest pain: Secondary | ICD-10-CM | POA: Diagnosis not present

## 2019-06-30 DIAGNOSIS — Z79899 Other long term (current) drug therapy: Secondary | ICD-10-CM | POA: Diagnosis not present

## 2019-06-30 DIAGNOSIS — F17210 Nicotine dependence, cigarettes, uncomplicated: Secondary | ICD-10-CM | POA: Diagnosis not present

## 2019-06-30 LAB — BASIC METABOLIC PANEL
Anion gap: 13 (ref 5–15)
BUN: 17 mg/dL (ref 6–20)
CO2: 22 mmol/L (ref 22–32)
Calcium: 9 mg/dL (ref 8.9–10.3)
Chloride: 102 mmol/L (ref 98–111)
Creatinine, Ser: 1.14 mg/dL (ref 0.61–1.24)
GFR calc Af Amer: >60 mL/min (ref >60)
GFR calc non Af Amer: >60 mL/min (ref >60)
Glucose, Bld: 103 mg/dL — ABNORMAL HIGH (ref 70–99)
Potassium: 3.6 mmol/L (ref 3.5–5.1)
Sodium: 137 mmol/L (ref 135–145)

## 2019-06-30 LAB — CBC
HCT: 45.2 % (ref 39.0–52.0)
Hemoglobin: 15.4 g/dL (ref 13.0–17.0)
MCH: 31.1 pg (ref 26.0–34.0)
MCHC: 34.1 g/dL (ref 30.0–36.0)
MCV: 91.3 fL (ref 80.0–100.0)
Platelets: 217 10*3/uL (ref 150–400)
RBC: 4.95 MIL/uL (ref 4.22–5.81)
RDW: 12.2 % (ref 11.5–15.5)
WBC: 9.6 10*3/uL (ref 4.0–10.5)
nRBC: 0 % (ref 0.0–0.2)

## 2019-06-30 LAB — TROPONIN I (HIGH SENSITIVITY): Troponin I (High Sensitivity): <2 ng/L (ref <18)

## 2019-06-30 LAB — D-DIMER, QUANTITATIVE: D-Dimer, Quant: <0.27 ug/mL-FEU (ref 0.00–0.50)

## 2019-06-30 MED ORDER — DICLOFENAC SODIUM 1 % TD GEL: 2.0000 g | Freq: Four times a day (QID) | TRANSDERMAL | 0 refills | Status: DC

## 2019-06-30 NOTE — ED Provider Notes (Signed)
[facility] DEPT Provider Note   CSN: [id number] Arrival date & time: 06/30/19  1715    History   Chief Complaint Chief Complaint  Patient presents with  . Chest Pain    HPI Dalton Johnson is a 23 y.o. male with h/o HTN, anxiety, GERD presents to ER for evaluation of chest pain. Onset 4-5 days ago. Described as "tension pain" located to left low anterior rib cage, constant. It goes away when he sleeps, wakes up and without pain for 15-20 mins but then later in the day pain returns and remains constant during the day. He is a Training and development officer at [employer] and states when he is at work busy, he doesn't notice the chest pain but when he sits down and is resting he notices it more. Sometimes it is more obvious if he is picking stuff up or moving things around. He had a recent right sided rib fracture and is taking 2 tylenols BID, no significant improvement in CP with this. Has had associated tingling to left arm and leg and shortness of breath. Described shortness of breath as mild "not really". He wonders if this is all related to his anxiety. Noticed palpitations when walking into the ER that freaked him out but no palpitations before this. He vapes occasionally. Compliant with lisinopril.   No fever, cough, hemoptysis, nausea, vomiting, abdominal pain, diaphoresis. No syncope or light headedness, arm or jaw pain. No LE edema or calf pain. No h/o DVT/PE. Recent admission for overdose requiring intubation and ICU stay 6/29-6/30.      HPI  Past Medical History:  Diagnosis Date  . Anxiety   . GERD (gastroesophageal reflux disease)   . Hypertension     Patient Active Problem List   Diagnosis Date Noted  . Overdose 06/01/2019  . Right rib fracture 05/29/2019  . Heroin overdose ([place]) 01/23/2019  . Rash of hands 01/23/2019  . Essential hypertension 01/23/2019  . Cellulitis 01/23/2019  . Polysubstance abuse [facility])     Past Surgical History:  Procedure  Laterality Date  . HAND SURGERY    . TONSILLECTOMY          Home Medications    Prior to Admission medications   Medication Sig Start Date End Date Taking? Authorizing Provider  Cholecalciferol (VITAMIN D-3) 125 MCG (5000 UT) TABS Take 1 tablet by mouth daily. 06/01/19   Hilts, Legrand Como, MD  diclofenac sodium (VOLTAREN) 1 % GEL Apply 2 g topically 4 (four) times daily. 06/30/19   Kinnie Feil, PA-C  gabapentin (NEURONTIN) 300 MG capsule Take 300 mg by mouth See admin instructions. Take 300mg  three times daily and 600mg  at bedtime 05/11/19   [provider]  lisinopril (ZESTRIL) 20 MG tablet Take 20 mg by mouth daily. 04/08/19   [provider]  Magnesium 400 MG CAPS Take 400 mg by mouth daily. 06/01/19   Hilts, Legrand Como, MD  Menaquinone-7 (VITAMIN K2) 100 MCG CAPS Take 100 mcg by mouth daily. 06/01/19   Hilts, Legrand Como, MD  pantoprazole (PROTONIX) 40 MG tablet Take 40 mg by mouth daily.  05/04/19   [provider]  PARoxetine (PAXIL) 30 MG tablet Take 30 mg by mouth daily.  04/08/19   [provider]    Family History No family history on file.  Social History Social History   Tobacco Use  . Smoking status: Current Every Day Smoker    Packs/day: 1.00    Years: 5.00    Pack years: 5.00  Types: Cigarettes  . Smokeless tobacco: Never Used  Substance Use Topics  . Alcohol use: Yes    Alcohol/week: 5.0 standard drinks    Types: 5 Cans of beer per week  . Drug use: Not Currently     Allergies   Patient has no known allergies.   Review of Systems Review of Systems  Cardiovascular: Positive for chest pain.  Neurological:       Paresthesias   All other systems reviewed and are negative.    Physical Exam Updated Vital Signs BP 121/75   Pulse 81   Temp 98.1 F (36.7 C) (Oral)   Resp 18   Ht 6\' 1"  (1.854 m)   Wt 77.1 kg   SpO2 100%   BMI 22.43 kg/m   Physical Exam Constitutional:      Appearance: He is well-developed.      Comments: NAD. Non toxic.   HENT:     Head: Normocephalic and atraumatic.     Nose: Nose normal.  Eyes:     General: Lids are normal.     Conjunctiva/sclera: Conjunctivae normal.  Neck:     Musculoskeletal: Normal range of motion.     Trachea: Trachea normal.     Comments: Trachea midline.  Cardiovascular:     Rate and Rhythm: Normal rate and regular rhythm.     Pulses:          Radial pulses are 1+ on the right side and 1+ on the left side.       Dorsalis pedis pulses are 1+ on the right side and 1+ on the left side.     Heart sounds: Normal heart sounds, S1 normal and S2 normal.     Comments: No LE edema or calf tenderness.  Pulmonary:     Effort: Pulmonary effort is normal.     Breath sounds: Normal breath sounds.  Chest:     Chest wall: Tenderness present.     Comments: Mild left posterior lateral rib tenderness. No anterior chest wall tenderness. Normal skin over chest.  Abdominal:     General: Bowel sounds are normal.     Palpations: Abdomen is soft.     Tenderness: There is no abdominal tenderness.     Comments: No epigastric tenderness. No distention.   Skin:    General: Skin is warm and dry.     Capillary Refill: Capillary refill takes less than 2 seconds.     Comments: No rash to chest wall  Neurological:     Mental Status: He is alert.     GCS: GCS eye subscore is 4. GCS verbal subscore is 5. GCS motor subscore is 6.     Comments: Sensation and strength intact in upper/lower extremities   Psychiatric:        Speech: Speech normal.        Behavior: Behavior normal.        Thought Content: Thought content normal.      ED Treatments / Results  Labs (all labs ordered are listed, but only abnormal results are displayed) Labs Reviewed  BASIC METABOLIC PANEL - Abnormal; Notable for the following components:      Result Value   Glucose, Bld 103 (*)    All other components within normal limits  CBC  D-DIMER, QUANTITATIVE (NOT AT [facility])  TROPONIN I (HIGH  SENSITIVITY)    EKG EKG Interpretation  Date/Time:  Tuesday June 30 2019 15:49:37 EDT Ventricular Rate:  127 PR Interval:    QRS  Duration: 119 QT Interval:  315 QTC Calculation: 458 R Axis:   71 Text Interpretation:  Sinus tachycardia Nonspecific intraventricular conduction delay ST elev, probable normal early repol pattern Baseline wander in lead(s) III Confirmed by Vanetta MuldersZackowski, Scott [phone]([id number]) on 06/30/2019 3:53:36 PM   Radiology Dg Chest 2 View  Result Date: 06/30/2019 CLINICAL DATA:  Chest pain.  Hypertension. EXAM: CHEST - 2 VIEW COMPARISON:  June 01, 2019 FINDINGS: Lungs are clear. Heart size and pulmonary vascularity are normal. No adenopathy. No pneumothorax. No bone lesions. IMPRESSION: No edema or consolidation. Electronically Signed   By: Bretta BangWilliam  Woodruff III M.D.   On: 06/30/2019 17:34    Procedures Procedures (including critical care time)  Medications Ordered in ED Medications - No data to display   Initial Impression / Assessment and Plan / ED Course  I have reviewed the triage vital signs and the nursing notes.  Pertinent labs & imaging results that were available during my care of the patient were reviewed by me and considered in my medical decision making (see chart for details).  Clinical Course as of Jun 30 133  Tue Jun 30, 2019  1956 Sinus tachycardia Nonspecific intraventricular conduction delay ST elev, probable normal early repol pattern Baseline wander in lead(s) III HR 127   EKG 12-Lead [CG]  2117 Pulse Rate: 81 [CG]    Clinical Course User Index [CG] Liberty HandyGibbons, Isis Costanza J, PA-C    Pt is a 23 y.o. male presents with what sounds like atypical CP.  Symptoms described as constant, non exertional, non pleuritic. Worse at rest and movement. Reproducible on exam. No associated concerning features such as fever, cough, SOB. Recent OD requiring ICU admission/intubation for 1 day.  No recent illnesses. No light-headedness, syncope, pleuritic pain, leg  swelling/calf pain to suggest DVT.  Cardiac risk factors include HTN, vaping. He denies h/o hypercholesterolemia, DM, obesity, smoking, positive family hx, known CAD.    VS WNL and stable. CV and pulmonary exam benign. There is reproducible CP with palpation.  No LE edema or calf tenderness. No epigastric tenderness. No neuro or pulse deficits.   Work up reviewed. EKG non-ischemic.  Hs-trop <2, given constant atypical CP delta trop not indicated based on HEART pathway. HEART score < 3. Other than recent ICU stay/intubation possible prolonged immobilization no other risks for PE, Wells Score is low and d-dimer negative.   Given symptomatology, exam, non ischemic cardiac work up in ER and HEART score patient is appropriate for discharge with PCP/cardiology f/u.  Work up not suggestive of symptomatic anemia, PE, PTX, dissection, ACS.  Likely atypical chest pain, possibly MSK etiology vs pleurisy vs costochondritis vs GI related.  Will dc with tylenol, voltaren gel. ED return preacutions given. Pt appears reliable for follow up, aware of symptoms that would warrant return to ER.  Pt is comfortable and agreeable with ER POC and discharge plan.   Final Clinical Impressions(s) / ED Diagnoses   Final diagnoses:  Atypical chest pain    ED Discharge Orders         Ordered    diclofenac sodium (VOLTAREN) 1 % GEL  4 times daily     06/30/19 2131           Liberty HandyGibbons, Iman Reinertsen J, PA-C 07/01/19 0134    Alvira MondaySchlossman, Erin, MD 07/02/19 1600

## 2019-06-30 NOTE — Discharge Instructions (Addendum)
You were seen in the ER for chest pain  ER work up today was reassuring, everything was normal including blood clot test.   Cause of pain may be muscular, inflammation of lung lining, possibly related to acid reflux  Take tylenol [phone] mg every 6 hours for the next 3-5 days. Apply topical voltaren gel to area and massage four times a day. Can apply heat as well.   Follow up with primary care doctor in 1 week if symptoms persist despite medicines  Please return to ED if: Your chest pain is worse or on exertion You have a cough that gets worse, or you cough up blood. You have severe pain in chest, back or abdomen. You have chest pain or shortness of breath with exertion or activity You have sudden, unexplained discomfort in your chest, with radiation arms, back, neck, or jaw. You suddenly have chest pain and begin to sweat, or your skin gets clammy. You feel chest pain with nausea or vomiting. You suddenly feel light-headed or faint. Your heart begins to beat quickly, or it feels like it is skipping beats. You have one sided leg swelling or calf pain

## 2019-06-30 NOTE — ED Notes (Signed)
ED Provider at bedside. 

## 2019-06-30 NOTE — ED Triage Notes (Signed)
Per pt, sates he has been having left lower CP radiating to back for a couple of days-states symptoms are freaking him out-states he has a history of HTN and anxiety

## 2019-06-30 NOTE — ED Notes (Signed)
An After Visit Summary was printed and given to the patient. Discharge instructions given and no further questions at this time.  

## 2019-07-16 ENCOUNTER — Ambulatory Visit: Payer: Commercial Managed Care - PPO | Admitting: Orthopaedic Surgery

## 2019-08-16 IMAGING — CR DG CHEST 2V
2 series · 2 of 2 positions shown · non-contrast
Comparison: PA and lateral chest x-ray July 27, 2018

CLINICAL DATA: Left lower chest and back pain and left arm pain.
History of hypertension, history of smoking.

EXAM:
CHEST - 2 VIEW

[w chest pa]
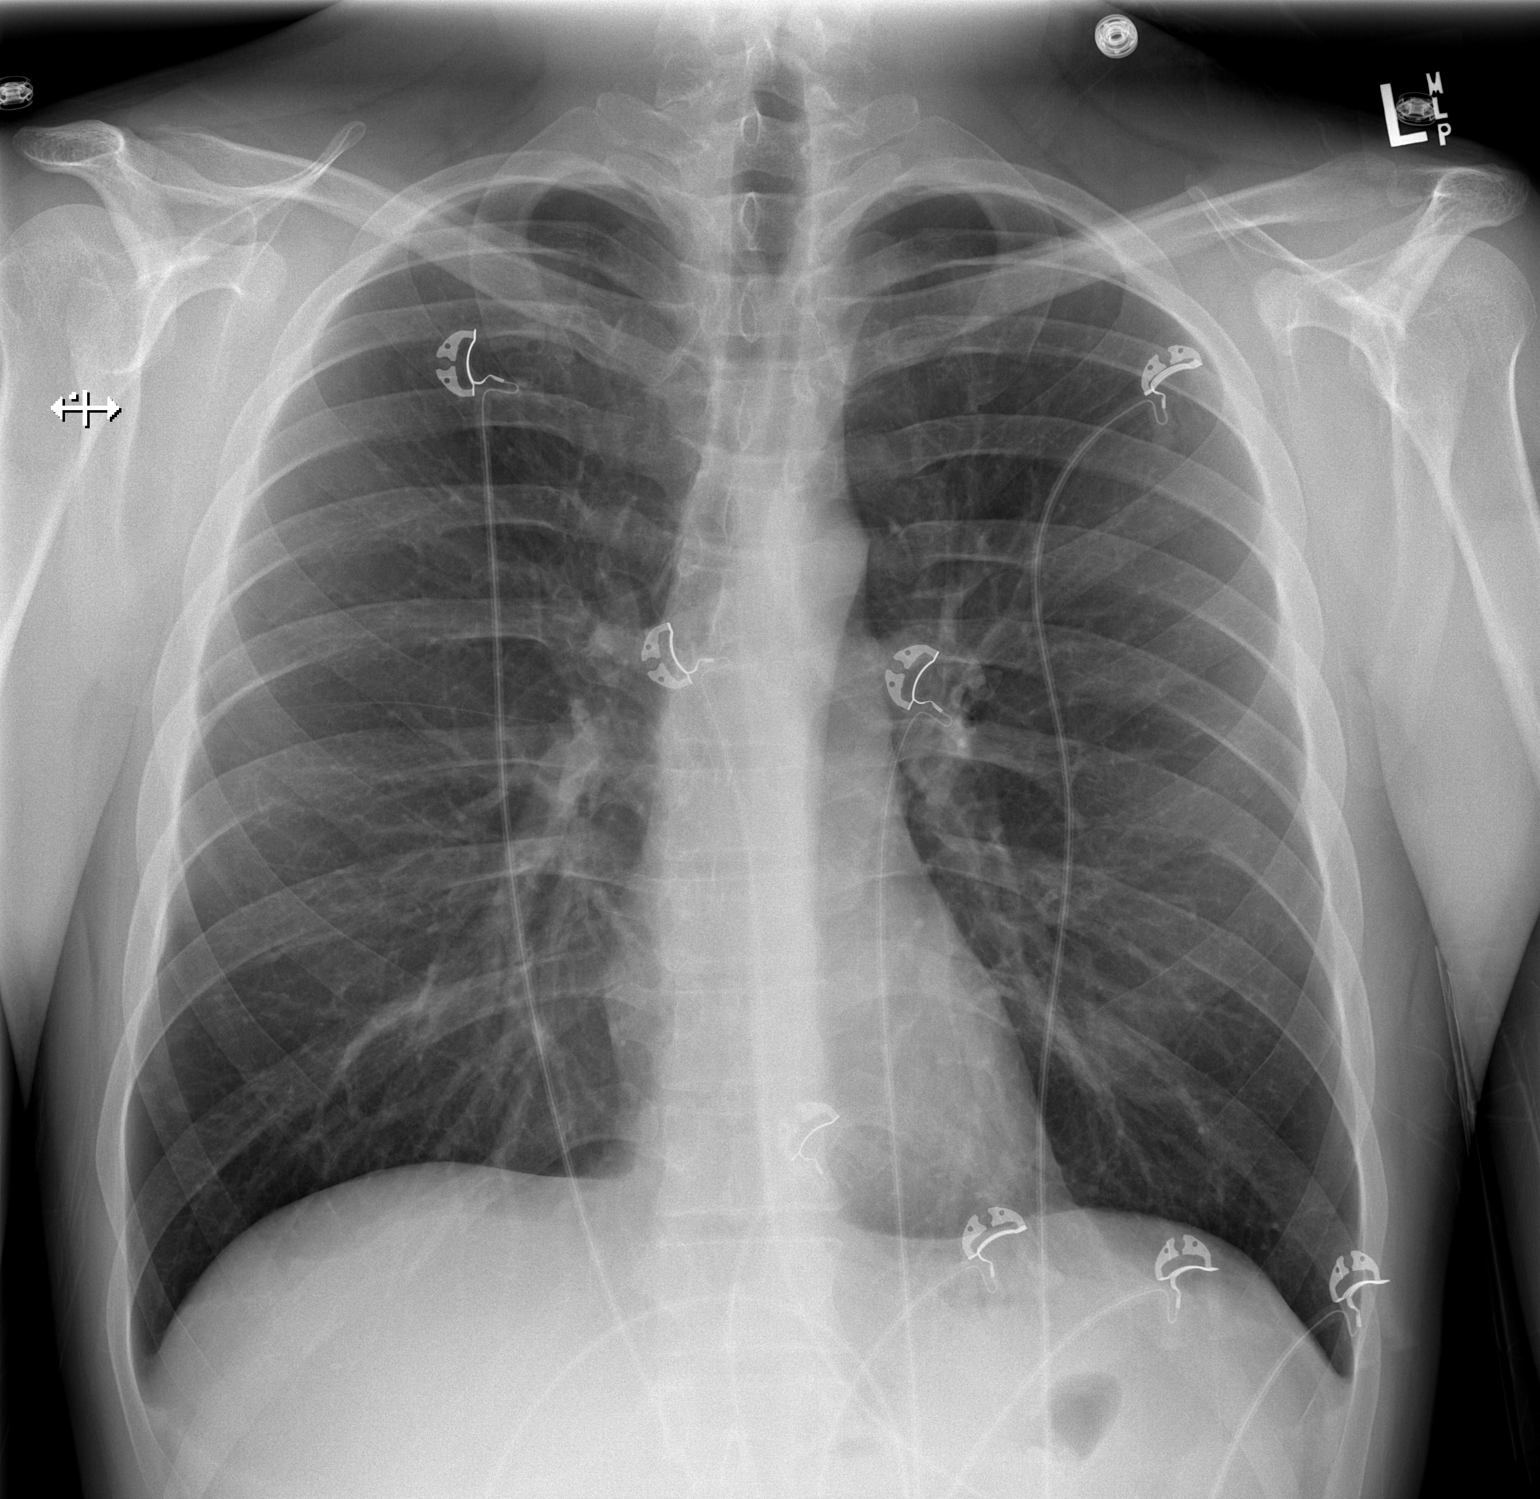

[w chest lat]
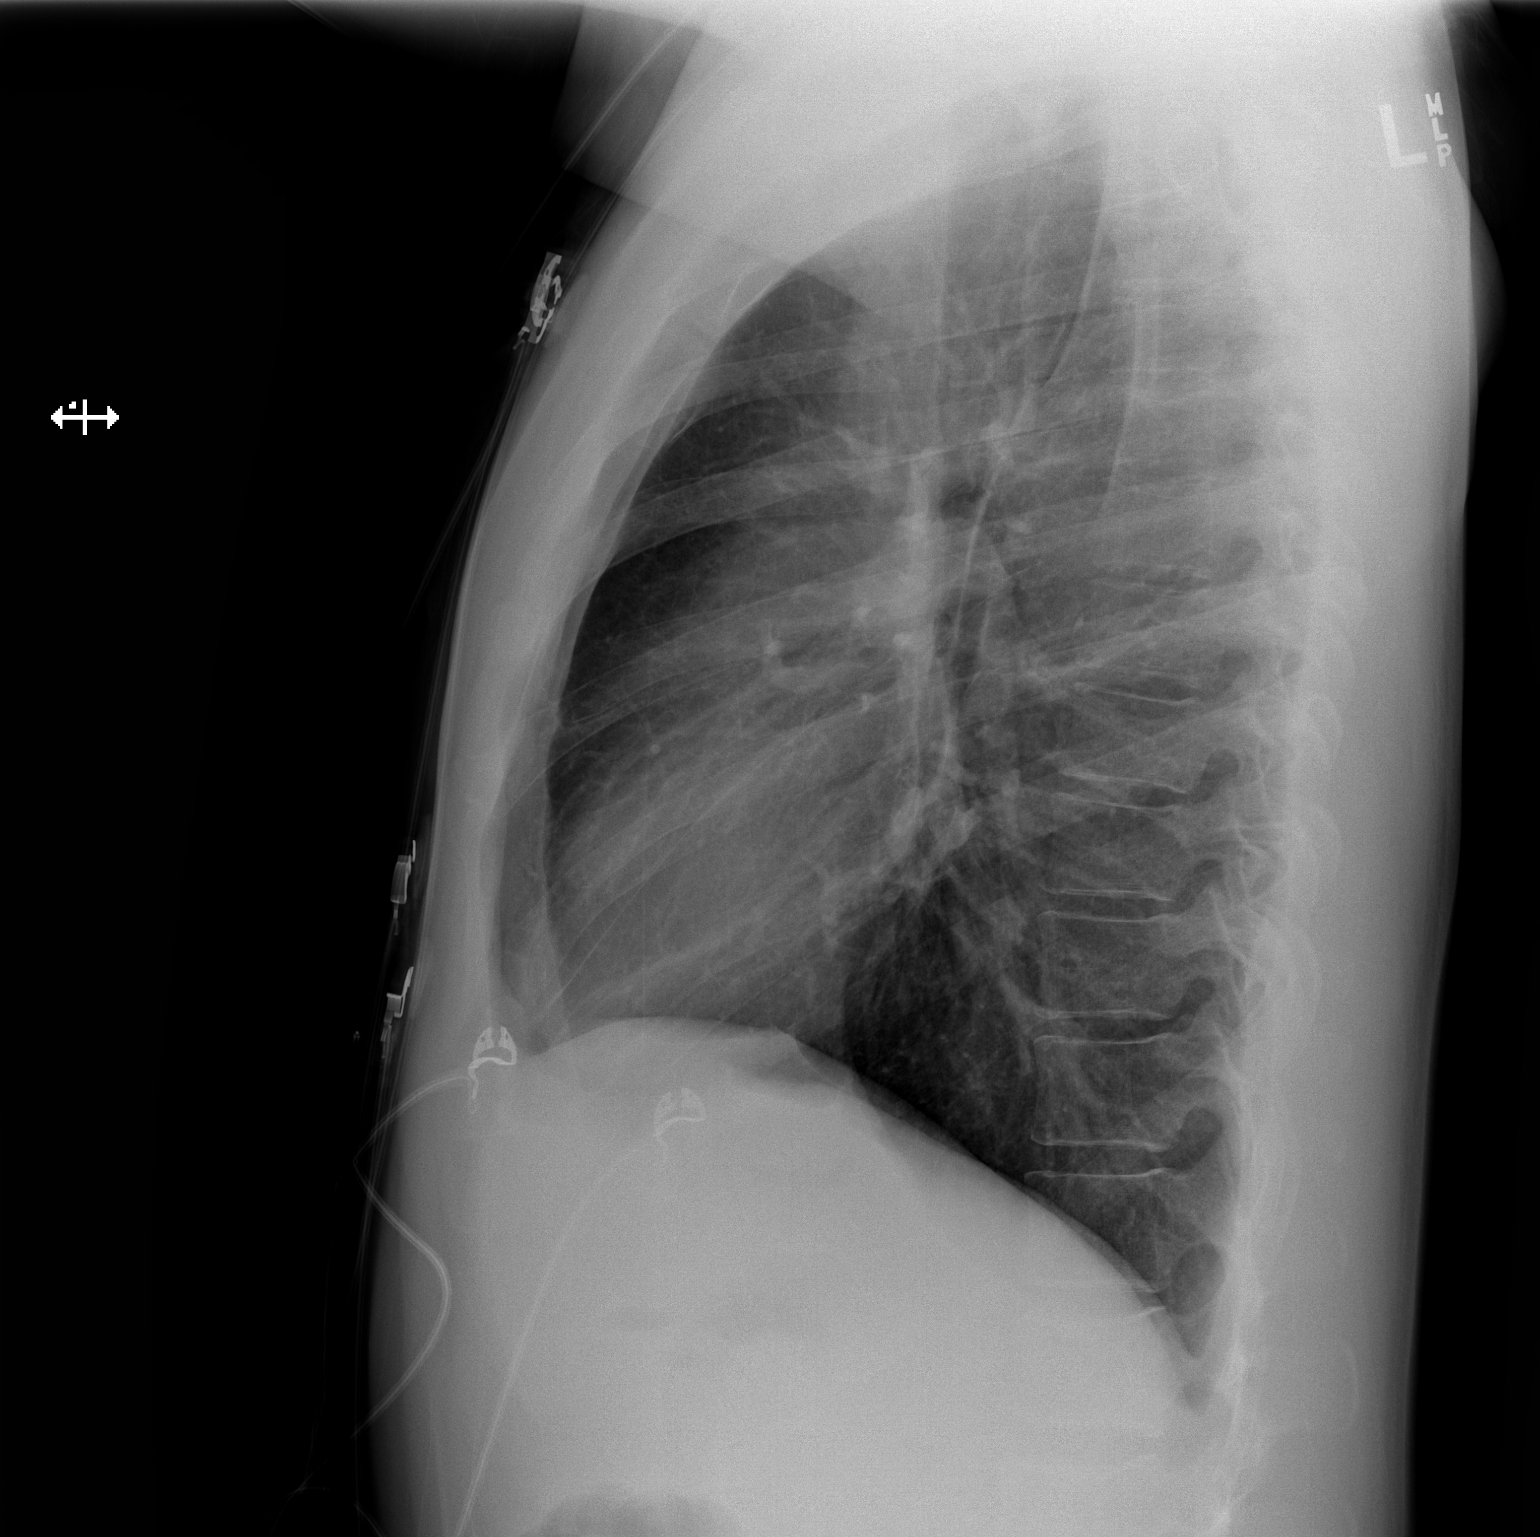

[2 of 2 positions shown; findings below may reference images not displayed]

FINDINGS: The lungs are mildly hyperinflated. There is no focal infiltrate.
There is no pleural effusion. The heart and pulmonary vascularity
are normal. The mediastinum is normal in width. The bony thorax
exhibits no acute abnormality.
IMPRESSION: Mild hyperinflation may be voluntary or may reflect underlying
reactive airway disease. No alveolar pneumonia nor other acute
cardiopulmonary abnormality. The observed portions of the bony
thorax are normal.

## 2020-03-11 IMAGING — CR CHEST - 2 VIEW
2 series · 2 of 2 positions shown · non-contrast
Comparison: June 01, 2019

CLINICAL DATA: Chest pain.  Hypertension.

EXAM:
CHEST - 2 VIEW

[w chest pa]
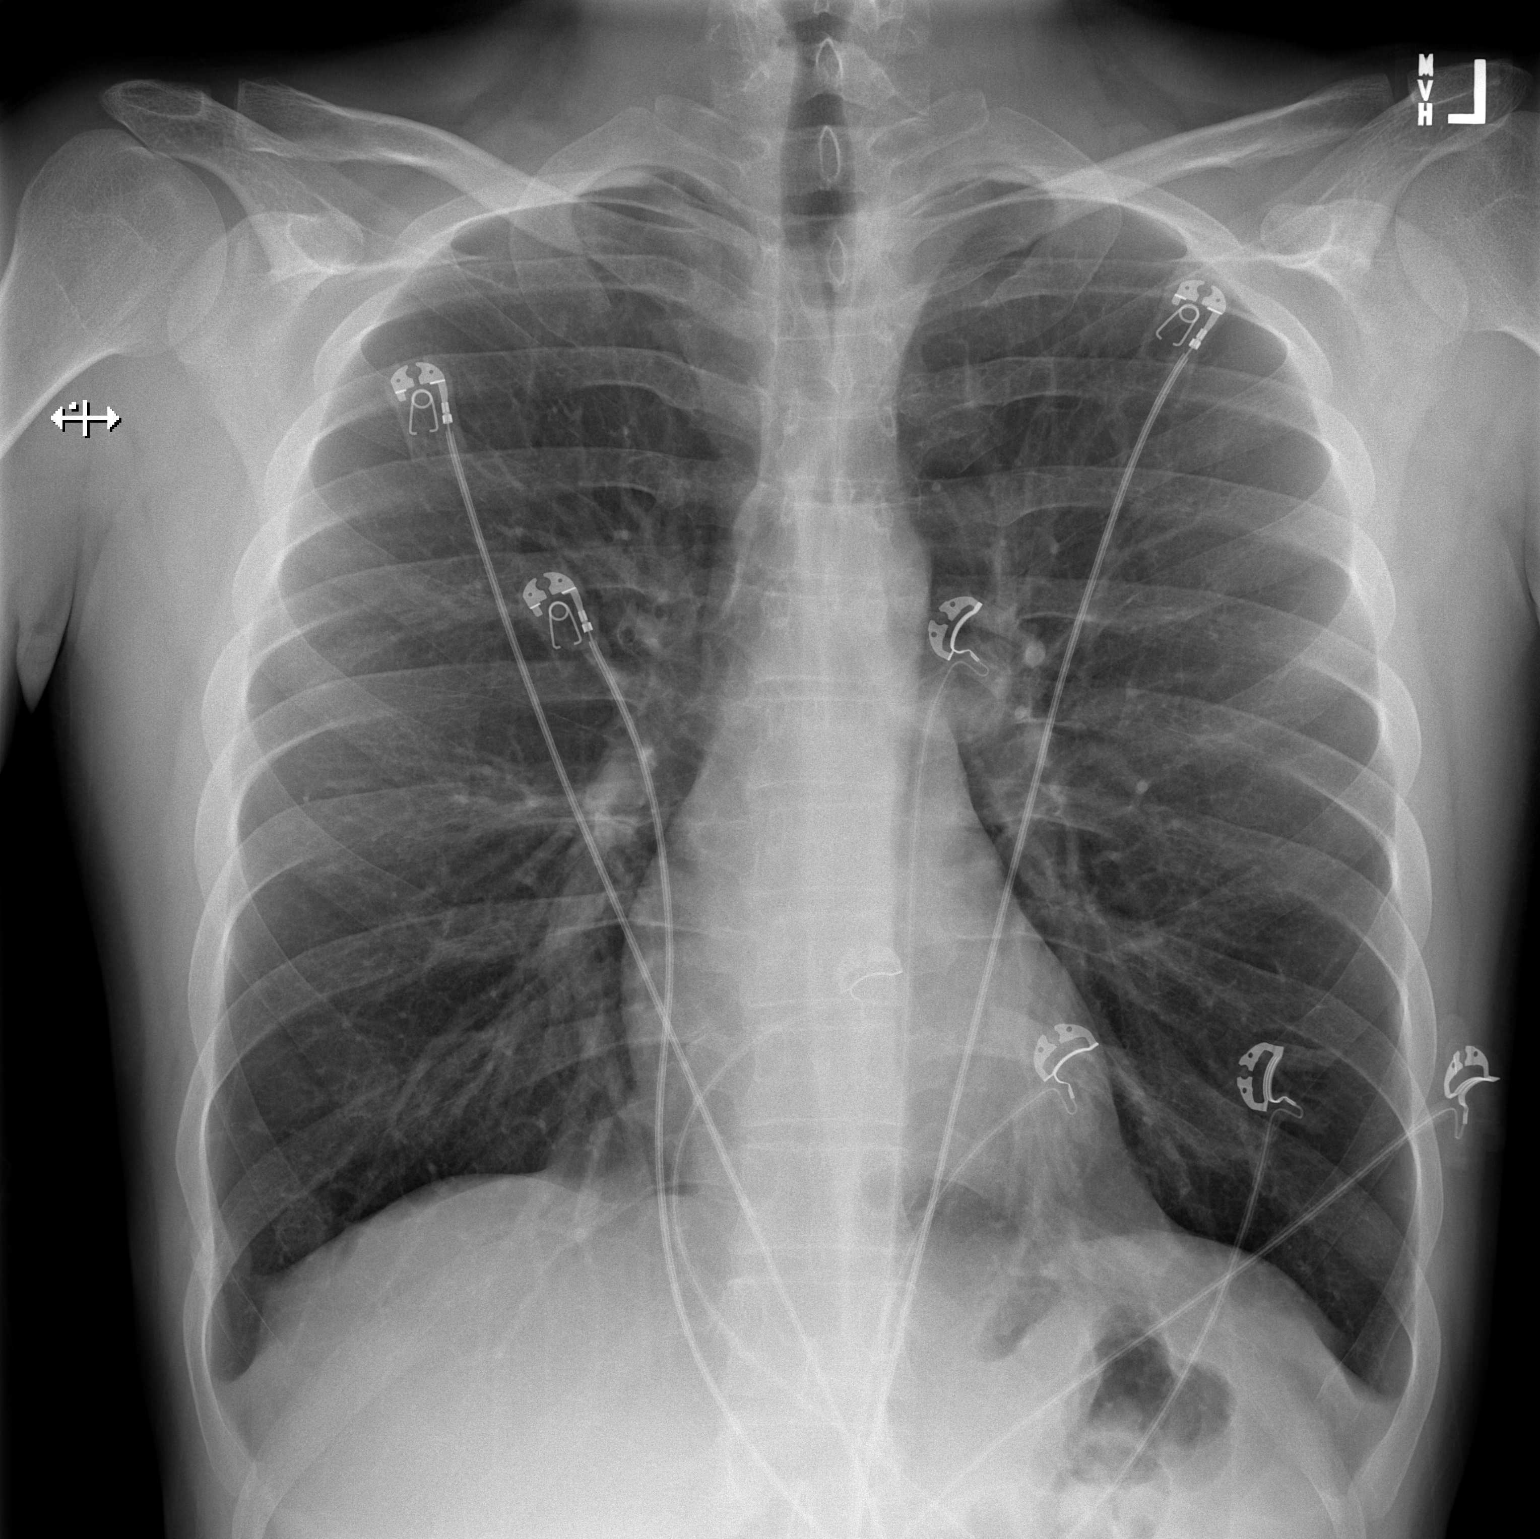

[w chest lat]
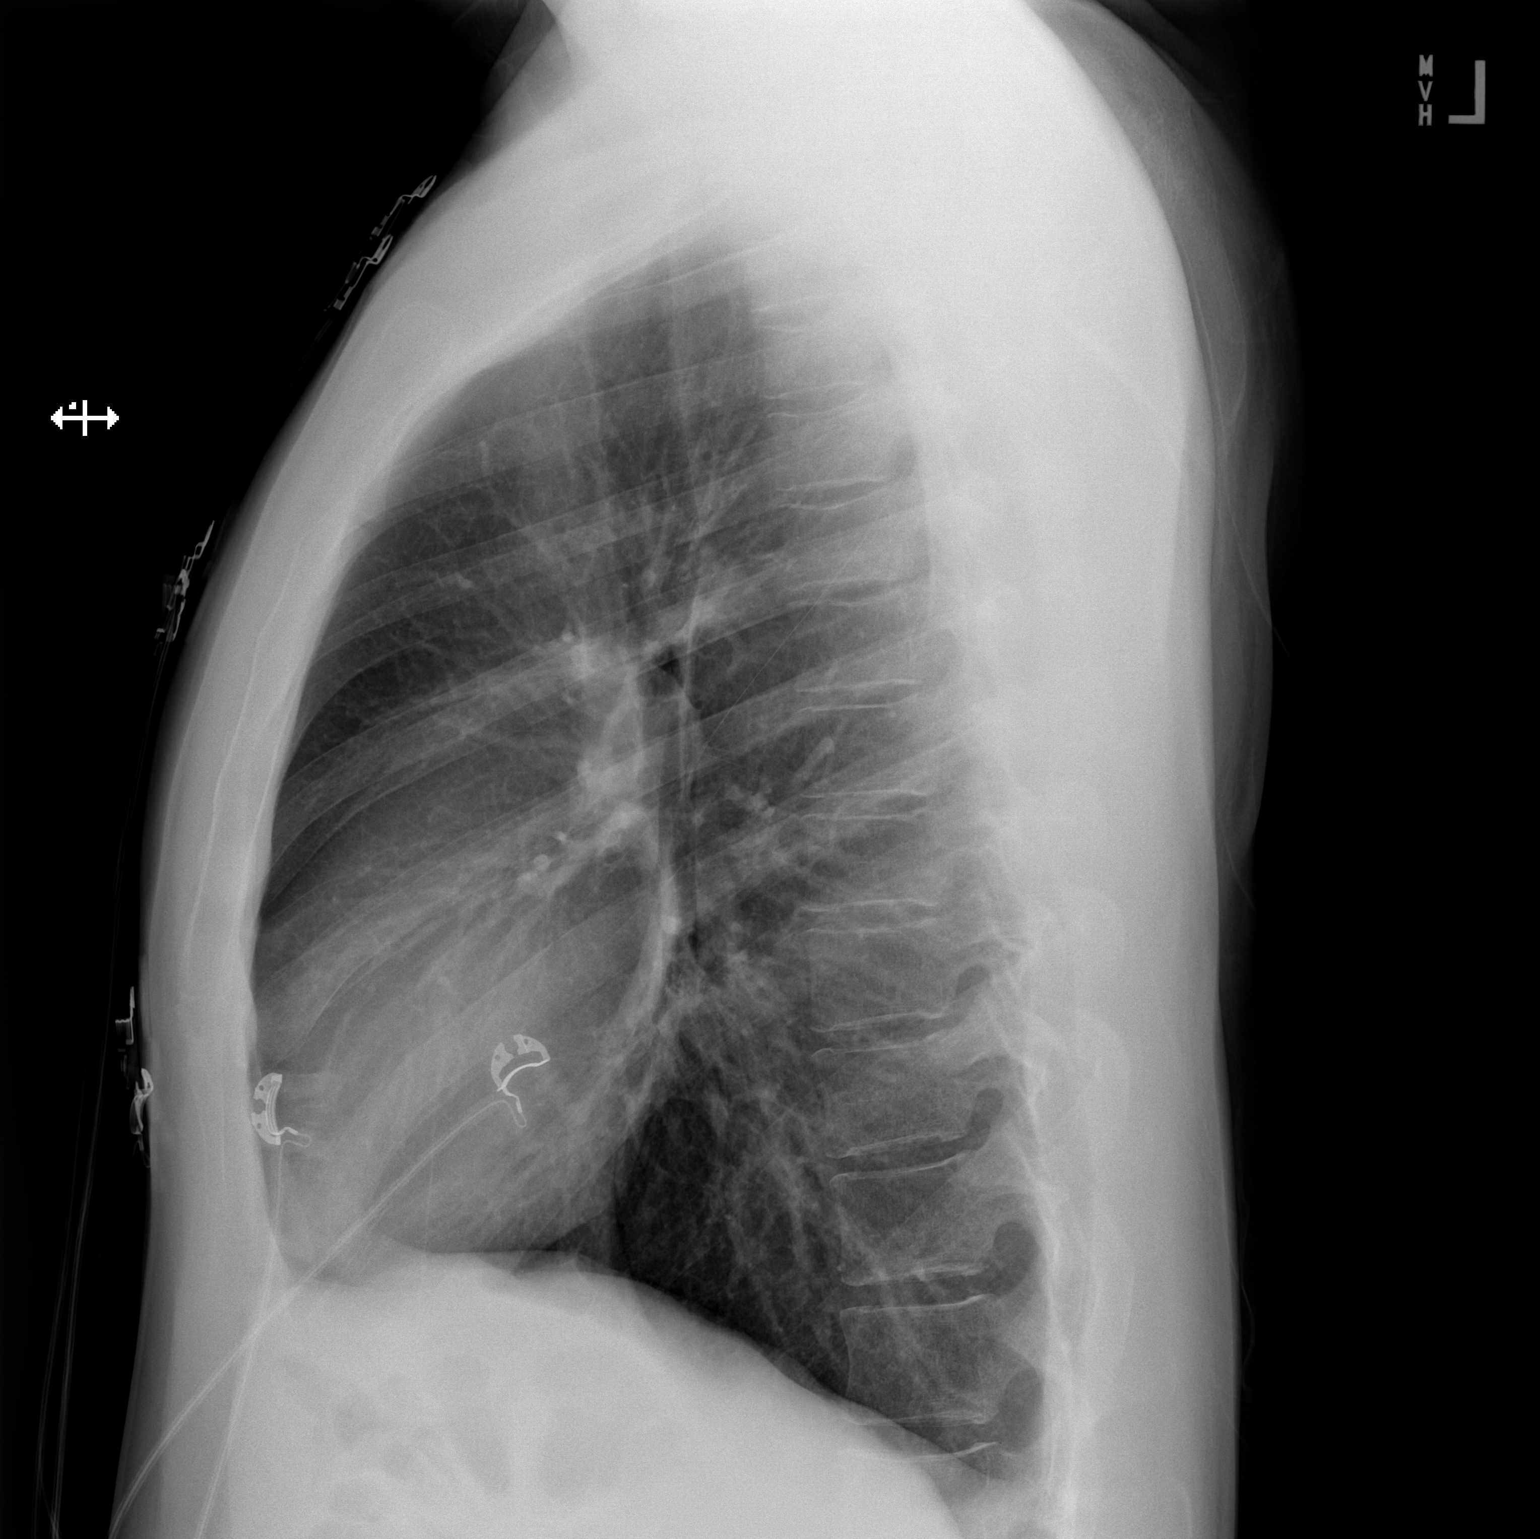

[2 of 2 positions shown; findings below may reference images not displayed]

FINDINGS: Lungs are clear. Heart size and pulmonary vascularity are normal. No
adenopathy. No pneumothorax. No bone lesions.
IMPRESSION: No edema or consolidation.

## 2020-11-07 ENCOUNTER — Encounter (HOSPITAL_COMMUNITY): Payer: Self-pay

## 2020-11-07 ENCOUNTER — Other Ambulatory Visit: Payer: Self-pay

## 2020-11-07 ENCOUNTER — Emergency Department (HOSPITAL_COMMUNITY)
Admission: EM | Admit: 2020-11-07 | Discharge: 2020-11-07 | Disposition: A | Discharge status: Home / Self Care | Payer: Commercial Managed Care - PPO | Attending: Emergency Medicine | Admitting: Emergency Medicine

## 2020-11-07 DIAGNOSIS — Z79899 Other long term (current) drug therapy: Secondary | ICD-10-CM | POA: Insufficient documentation

## 2020-11-07 DIAGNOSIS — T22211A Burn of second degree of right forearm, initial encounter: Secondary | ICD-10-CM

## 2020-11-07 DIAGNOSIS — F17210 Nicotine dependence, cigarettes, uncomplicated: Secondary | ICD-10-CM | POA: Insufficient documentation

## 2020-11-07 DIAGNOSIS — X102XXA Contact with fats and cooking oils, initial encounter: Secondary | ICD-10-CM | POA: Insufficient documentation

## 2020-11-07 DIAGNOSIS — I10 Essential (primary) hypertension: Secondary | ICD-10-CM | POA: Insufficient documentation

## 2020-11-07 MED ORDER — SILVER SULFADIAZINE 1 % EX CREA
TOPICAL_CREAM | Freq: Once | CUTANEOUS | Status: AC
  Administered 2020-11-07: 1 via TOPICAL
  Filled 2020-11-07: qty 50

## 2020-11-07 NOTE — ED Provider Notes (Signed)
[facility]-EMERGENCY DEPT Provider Note   CSN: [id number] Arrival date & time: 11/07/20  1227     History Chief Complaint  Patient presents with  . Burn    Dalton Johnson is a 24 y.o. male presenting for evaluation of burn.  Patient states he was at work when [employer] and hit his right arm.  This occurred approximately 36 hours ago.  He reports he has been washing it out for times a day with soap and water and applying over-the-counter antibiotic cream.  He states initially there were 2 large blisters, these have popped on their own.  He denies increasing pain, redness, swelling or drainage.  He denies fevers or chills.  He has not taken anything for pain, reports pain is well controlled.  He does not take any medicines every day. Is not immunocompromised.  HPI     Past Medical History:  Diagnosis Date  . Anxiety   . GERD (gastroesophageal reflux disease)   . Hypertension     Patient Active Problem List   Diagnosis Date Noted  . Overdose 06/01/2019  . Right rib fracture 05/29/2019  . Heroin overdose (HCC) 01/23/2019  . Rash of hands 01/23/2019  . Essential hypertension 01/23/2019  . Cellulitis 01/23/2019  . Polysubstance abuse [facility])     Past Surgical History:  Procedure Laterality Date  . HAND SURGERY    . TONSILLECTOMY         History reviewed. No pertinent family history.  Social History   Tobacco Use  . Smoking status: Current Every Day Smoker    Packs/day: 1.00    Years: 5.00    Pack years: 5.00    Types: Cigarettes  . Smokeless tobacco: Never Used  Vaping Use  . Vaping Use: Never used  Substance Use Topics  . Alcohol use: Yes    Alcohol/week: 5.0 standard drinks    Types: 5 Cans of beer per week  . Drug use: Not Currently    Home Medications Prior to Admission medications   Medication Sig Start Date End Date Taking? Authorizing Provider  Cholecalciferol (VITAMIN D-3) 125 MCG (5000 UT) TABS Take 1 tablet by  mouth daily. 06/01/19   Hilts, Casimiro Needle, MD  diclofenac sodium (VOLTAREN) 1 % GEL Apply 2 g topically 4 (four) times daily. 06/30/19   Liberty Handy, PA-C  gabapentin (NEURONTIN) 300 MG capsule Take 300 mg by mouth See admin instructions. Take 300mg  three times daily and 600mg  at bedtime 05/11/19   [provider]  lisinopril (ZESTRIL) 20 MG tablet Take 20 mg by mouth daily. 04/08/19   [provider]  Magnesium 400 MG CAPS Take 400 mg by mouth daily. 06/01/19   Hilts, 06/08/19, MD  Menaquinone-7 (VITAMIN K2) 100 MCG CAPS Take 100 mcg by mouth daily. 06/01/19   Hilts, Casimiro Needle, MD  pantoprazole (PROTONIX) 40 MG tablet Take 40 mg by mouth daily.  05/04/19   [provider]  PARoxetine (PAXIL) 30 MG tablet Take 30 mg by mouth daily.  04/08/19   [provider]    Allergies    Patient has no known allergies.  Review of Systems   Review of Systems  Skin: Positive for rash.  Allergic/Immunologic: Negative for immunocompromised state.    Physical Exam Updated Vital Signs BP (!) 134/96 (BP Location: Right Arm)   Pulse (!) 56   Temp (!) 97.5 F (36.4 C) (Oral)   Resp 18   Ht 6\' 1"  (1.854 m)   Wt 77.1  kg   SpO2 100%   BMI 22.43 kg/m   Physical Exam Vitals and nursing note reviewed.  Constitutional:      General: He is not in acute distress.    Appearance: He is well-developed.     Comments: Appears nontoxic  HENT:     Head: Normocephalic and atraumatic.  Pulmonary:     Effort: Pulmonary effort is normal.  Abdominal:     General: There is no distension.  Musculoskeletal:        General: Normal range of motion.     Cervical back: Normal range of motion.     Comments: Radial pulse 2+ bilaterally. Soft compartments of the R forearm.  Skin:    General: Skin is warm.     Capillary Refill: Capillary refill takes less than 2 seconds.     Findings: Erythema present. No rash.     Comments: Burn of the R forearm and R dorsal hand. Not circumferential.  blisters have popped. No induration. No significant tenderness. No drainage.   Neurological:     Mental Status: He is alert and oriented to person, place, and time.              ED Results / Procedures / Treatments   Labs (all labs ordered are listed, but only abnormal results are displayed) Labs Reviewed - No data to display  EKG None  Radiology No results found.  Procedures Procedures (including critical care time)  Medications Ordered in ED Medications  silver sulfADIAZINE (SILVADENE) 1 % cream (has no administration in time range)    ED Course  I have reviewed the triage vital signs and the nursing notes.  Pertinent labs & imaging results that were available during my care of the patient were reviewed by me and considered in my medical decision making (see chart for details).    MDM Rules/Calculators/A&P                          Patient presenting for evaluation after burn of the right forearm.  On exam, patient appears nontoxic.  No signs of secondary infection. Pain is well controlled. With tx with silvadene cream and monitoring. At this time, pt appears safe for d/c. Return precautions given. Pt states he understands and agrees to plan.   Final Clinical Impression(s) / ED Diagnoses Final diagnoses:  Partial thickness burn of right forearm, initial encounter    Rx / DC Orders ED Discharge Orders    None       Alveria Apley, PA-C 11/07/20 1617    Terrilee Files, MD 11/08/20 1001

## 2020-11-07 NOTE — ED Triage Notes (Signed)
Pt reports he burnt his arm right forearm with grease yesterday. Pt developed two large blisters and one ruptured right before he came in. Wound dressed with nonadherent dressing and curlex in triage.

## 2020-11-07 NOTE — Discharge Instructions (Signed)
Use Silvadene cream twice a day for the next week. Wash daily with soap and water. Follow-up with your primary care doctor as needed for recheck. Return to the emergency room if you develop fevers, severe worsening pain, pus draining from the area, or any new, worsening, or concerning symptoms

## 2021-01-26 ENCOUNTER — Encounter (HOSPITAL_COMMUNITY): Payer: Self-pay

## 2021-01-26 ENCOUNTER — Other Ambulatory Visit: Payer: Self-pay

## 2021-01-26 ENCOUNTER — Emergency Department (HOSPITAL_COMMUNITY)
Admission: EM | Admit: 2021-01-26 | Discharge: 2021-01-26 | Disposition: A | Discharge status: Home / Self Care | Payer: Self-pay | Attending: Emergency Medicine | Admitting: Emergency Medicine

## 2021-01-26 ENCOUNTER — Emergency Department (HOSPITAL_COMMUNITY): Payer: Self-pay

## 2021-01-26 DIAGNOSIS — I10 Essential (primary) hypertension: Secondary | ICD-10-CM | POA: Insufficient documentation

## 2021-01-26 DIAGNOSIS — Z79899 Other long term (current) drug therapy: Secondary | ICD-10-CM | POA: Insufficient documentation

## 2021-01-26 DIAGNOSIS — M25562 Pain in left knee: Secondary | ICD-10-CM | POA: Insufficient documentation

## 2021-01-26 DIAGNOSIS — Z87891 Personal history of nicotine dependence: Secondary | ICD-10-CM | POA: Insufficient documentation

## 2021-01-26 MED ORDER — IBUPROFEN 400 MG PO TABS: 400.0000 mg | ORAL_TABLET | Freq: Four times a day (QID) | ORAL | 0 refills | Status: DC | PRN

## 2021-01-26 MED ORDER — OXYCODONE-ACETAMINOPHEN 5-325 MG PO TABS: 1.0000 | ORAL_TABLET | ORAL | 0 refills | Status: DC | PRN

## 2021-01-26 MED ORDER — IBUPROFEN 600 MG PO TABS: 600.0000 mg | ORAL_TABLET | Freq: Four times a day (QID) | ORAL | 0 refills | Status: DC | PRN

## 2021-01-26 MED ORDER — OXYCODONE-ACETAMINOPHEN 5-325 MG PO TABS
1.0000 | ORAL_TABLET | Freq: Once | ORAL | Status: AC
  Administered 2021-01-26: 1 via ORAL
  Filled 2021-01-26: qty 1

## 2021-01-26 MED ORDER — IBUPROFEN 800 MG PO TABS: 800.0000 mg | ORAL_TABLET | Freq: Once | ORAL | Status: DC

## 2021-01-26 NOTE — ED Notes (Signed)
Patient transported to X-ray 

## 2021-01-26 NOTE — Progress Notes (Signed)
Orthopedic Tech Progress Note Patient Details:  Dalton Johnson 16-Apr-1996 [id number]  Ortho Devices Ortho Device/Splint Location: knee sleeve LLE and crutches Ortho Device/Splint Interventions: Ordered,Application,Adjustment   Post Interventions Patient Tolerated: Well Instructions Provided: Care of device   [name] 01/26/2021, 8:01 PM

## 2021-01-26 NOTE — ED Provider Notes (Signed)
[facility]-EMERGENCY DEPT Provider Note   CSN: [id number] Arrival date & time: 01/26/21  1845     History Chief Complaint  Patient presents with  . Knee Pain    Dalton Johnson is a 25 y.o. male.  25 year old male presents with acute onset of left knee pain which occurred just prior to arrival.  Patient states that he does lots of standing when he works.  Denies any history of direct trauma.  No prior history of knee injuries.  No fever or chills.  No other joint discomfort.  Took aspirin without relief        Past Medical History:  Diagnosis Date  . Anxiety   . GERD (gastroesophageal reflux disease)   . Hypertension     Patient Active Problem List   Diagnosis Date Noted  . Overdose 06/01/2019  . Right rib fracture 05/29/2019  . Heroin overdose (HCC) 01/23/2019  . Rash of hands 01/23/2019  . Essential hypertension 01/23/2019  . Cellulitis 01/23/2019  . Polysubstance abuse [facility])     Past Surgical History:  Procedure Laterality Date  . HAND SURGERY    . TONSILLECTOMY         Family History  Family history unknown: Yes    Social History   Tobacco Use  . Smoking status: Former Smoker    Packs/day: 1.00    Years: 5.00    Pack years: 5.00    Types: Cigarettes  . Smokeless tobacco: Never Used  Vaping Use  . Vaping Use: Never used  Substance Use Topics  . Alcohol use: Yes    Alcohol/week: 5.0 standard drinks    Types: 5 Cans of beer per week  . Drug use: Not Currently    Home Medications Prior to Admission medications   Medication Sig Start Date End Date Taking? Authorizing Provider  Cholecalciferol (VITAMIN D-3) 125 MCG (5000 UT) TABS Take 1 tablet by mouth daily. 06/01/19   Hilts, Casimiro Needle, MD  diclofenac sodium (VOLTAREN) 1 % GEL Apply 2 g topically 4 (four) times daily. 06/30/19   Liberty Handy, PA-C  gabapentin (NEURONTIN) 300 MG capsule Take 300 mg by mouth See admin instructions. Take 300mg  three times daily and  600mg  at bedtime 05/11/19   [provider]  lisinopril (ZESTRIL) 20 MG tablet Take 20 mg by mouth daily. 04/08/19   [provider]  Magnesium 400 MG CAPS Take 400 mg by mouth daily. 06/01/19   Hilts, 06/08/19, MD  Menaquinone-7 (VITAMIN K2) 100 MCG CAPS Take 100 mcg by mouth daily. 06/01/19   Hilts, Casimiro Needle, MD  pantoprazole (PROTONIX) 40 MG tablet Take 40 mg by mouth daily.  05/04/19   [provider]  PARoxetine (PAXIL) 30 MG tablet Take 30 mg by mouth daily.  04/08/19   [provider]    Allergies    Patient has no known allergies.  Review of Systems   Review of Systems  All other systems reviewed and are negative.   Physical Exam Updated Vital Signs BP (!) 137/117 (BP Location: Left Arm)   Pulse 95   Temp 98 F (36.7 C) (Oral)   Resp 16   Ht 1.88 m (6\' 2" )   Wt 77.1 kg   SpO2 100%   BMI 21.83 kg/m   Physical Exam Vitals and nursing note reviewed.  Constitutional:      General: He is not in acute distress.    Appearance: Normal appearance. He is well-developed and well-nourished. He is not toxic-appearing.  HENT:     Head: Normocephalic and atraumatic.  Eyes:     General: Lids are normal.     Extraocular Movements: EOM normal.     Conjunctiva/sclera: Conjunctivae normal.     Pupils: Pupils are equal, round, and reactive to light.  Neck:     Thyroid: No thyroid mass.     Trachea: No tracheal deviation.  Cardiovascular:     Rate and Rhythm: Normal rate and regular rhythm.     Heart sounds: Normal heart sounds. No murmur heard. No gallop.   Pulmonary:     Effort: Pulmonary effort is normal. No respiratory distress.     Breath sounds: Normal breath sounds. No stridor. No decreased breath sounds, wheezing, rhonchi or rales.  Abdominal:     General: Bowel sounds are normal. There is no distension.     Palpations: Abdomen is soft.     Tenderness: There is no abdominal tenderness. There is no CVA tenderness or rebound.  Musculoskeletal:         General: No tenderness or edema.     Cervical back: Normal range of motion and neck supple.     Left knee: Bony tenderness present. No effusion or erythema. Decreased range of motion.       Legs:  Skin:    General: Skin is warm and dry.     Findings: No abrasion or rash.  Neurological:     Mental Status: He is alert and oriented to person, place, and time.     GCS: GCS eye subscore is 4. GCS verbal subscore is 5. GCS motor subscore is 6.     Cranial Nerves: No cranial nerve deficit.     Sensory: No sensory deficit.     Deep Tendon Reflexes: Strength normal.  Psychiatric:        Mood and Affect: Mood and affect normal.        Speech: Speech normal.        Behavior: Behavior normal.     ED Results / Procedures / Treatments   Labs (all labs ordered are listed, but only abnormal results are displayed) Labs Reviewed - No data to display  EKG None  Radiology No results found.  Procedures Procedures   Medications Ordered in ED Medications  oxyCODONE-acetaminophen (PERCOCET/ROXICET) 5-325 MG per tablet 1 tablet (has no administration in time range)    ED Course  I have reviewed the triage vital signs and the nursing notes.  Pertinent labs & imaging results that were available during my care of the patient were reviewed by me and considered in my medical decision making (see chart for details).    MDM Rules/Calculators/A&P                          X-ray of knee shows no evidence of fracture.  Will be given crutches and knee sleeve.  Medicated with Percocet here.  Will discharge home Final Clinical Impression(s) / ED Diagnoses Final diagnoses:  None    Rx / DC Orders ED Discharge Orders    None       Lorre Nick, MD 01/26/21 1944

## 2021-01-26 NOTE — ED Triage Notes (Signed)
Patient was at work and states he began having left knee pain. Patient states he does not know if he twisted it or not.

## 2021-07-09 ENCOUNTER — Other Ambulatory Visit: Payer: Self-pay

## 2021-07-09 ENCOUNTER — Emergency Department (HOSPITAL_COMMUNITY): Payer: Self-pay

## 2021-07-09 ENCOUNTER — Encounter (HOSPITAL_COMMUNITY): Payer: Self-pay | Admitting: Emergency Medicine

## 2021-07-09 ENCOUNTER — Emergency Department (HOSPITAL_COMMUNITY)
Admission: EM | Admit: 2021-07-09 | Discharge: 2021-07-09 | Disposition: A | Discharge status: Home / Self Care | Payer: Self-pay | Attending: Emergency Medicine | Admitting: Emergency Medicine

## 2021-07-09 DIAGNOSIS — M25511 Pain in right shoulder: Secondary | ICD-10-CM | POA: Insufficient documentation

## 2021-07-09 DIAGNOSIS — K219 Gastro-esophageal reflux disease without esophagitis: Secondary | ICD-10-CM | POA: Insufficient documentation

## 2021-07-09 DIAGNOSIS — R1011 Right upper quadrant pain: Secondary | ICD-10-CM

## 2021-07-09 DIAGNOSIS — Z79899 Other long term (current) drug therapy: Secondary | ICD-10-CM | POA: Insufficient documentation

## 2021-07-09 DIAGNOSIS — Z87891 Personal history of nicotine dependence: Secondary | ICD-10-CM | POA: Insufficient documentation

## 2021-07-09 DIAGNOSIS — I10 Essential (primary) hypertension: Secondary | ICD-10-CM | POA: Insufficient documentation

## 2021-07-09 LAB — COMPREHENSIVE METABOLIC PANEL
ALT: 86 U/L — ABNORMAL HIGH (ref 0–44)
AST: 23 U/L (ref 15–41)
Albumin: 4.2 g/dL (ref 3.5–5.0)
Alkaline Phosphatase: 83 U/L (ref 38–126)
Anion gap: 10 (ref 5–15)
BUN: 17 mg/dL (ref 6–20)
CO2: 26 mmol/L (ref 22–32)
Calcium: 9.1 mg/dL (ref 8.9–10.3)
Chloride: 103 mmol/L (ref 98–111)
Creatinine, Ser: 1.15 mg/dL (ref 0.61–1.24)
GFR, Estimated: >60 mL/min (ref >60)
Glucose, Bld: 85 mg/dL (ref 70–99)
Potassium: 4 mmol/L (ref 3.5–5.1)
Sodium: 139 mmol/L (ref 135–145)
Total Bilirubin: 0.8 mg/dL (ref 0.3–1.2)
Total Protein: 6.9 g/dL (ref 6.5–8.1)

## 2021-07-09 LAB — CBC WITH DIFFERENTIAL/PLATELET
Abs Immature Granulocytes: 0.02 10*3/uL (ref 0.00–0.07)
Basophils Absolute: 0 10*3/uL (ref 0.0–0.1)
Basophils Relative: 1 %
Eosinophils Absolute: 0.2 10*3/uL (ref 0.0–0.5)
Eosinophils Relative: 3 %
HCT: 42 % (ref 39.0–52.0)
Hemoglobin: 14.4 g/dL (ref 13.0–17.0)
Immature Granulocytes: 0 %
Lymphocytes Relative: 29 %
Lymphs Abs: 1.7 10*3/uL (ref 0.7–4.0)
MCH: 30.6 pg (ref 26.0–34.0)
MCHC: 34.3 g/dL (ref 30.0–36.0)
MCV: 89.2 fL (ref 80.0–100.0)
Monocytes Absolute: 0.5 10*3/uL (ref 0.1–1.0)
Monocytes Relative: 8 %
Neutro Abs: 3.5 10*3/uL (ref 1.7–7.7)
Neutrophils Relative %: 59 %
Platelets: 213 10*3/uL (ref 150–400)
RBC: 4.71 MIL/uL (ref 4.22–5.81)
RDW: 12.1 % (ref 11.5–15.5)
WBC: 5.9 10*3/uL (ref 4.0–10.5)
nRBC: 0 % (ref 0.0–0.2)

## 2021-07-09 LAB — URINALYSIS, ROUTINE W REFLEX MICROSCOPIC
Bilirubin Urine: NEGATIVE
Glucose, UA: NEGATIVE mg/dL
Hgb urine dipstick: NEGATIVE
Ketones, ur: NEGATIVE mg/dL
Leukocytes,Ua: NEGATIVE
Nitrite: NEGATIVE
Protein, ur: NEGATIVE mg/dL
Specific Gravity, Urine: 1.014 (ref 1.005–1.030)
pH: 7 (ref 5.0–8.0)

## 2021-07-09 LAB — LIPASE, BLOOD: Lipase: 30 U/L (ref 11–51)

## 2021-07-09 NOTE — Discharge Instructions (Addendum)
Start back on your omeprazole for the next 2 weeks.  Follow-up with gastroenterology or your primary care doctor.  Watch for triggers that may cause the pain and drink less alcohol

## 2021-07-09 NOTE — ED Provider Notes (Signed)
[facility]-EMERGENCY DEPT Provider Note   CSN: [id number] Arrival date & time: 07/09/21  2020     History Chief Complaint  Patient presents with   Abdominal Pain    Dalton Johnson is a 25 y.o. male.   Abdominal Pain Associated symptoms: no diarrhea, no fever, no nausea, no shortness of breath and no vomiting   PAtient presents with abdominal pain.  Epigastric right upper quadrant.  Has had over the last 3 days.  Comes and goes.  Gets severe at times.  States will radiate to his right shoulder.  No nausea or vomiting.  Sometimes worse after eating but sometimes better after eating.  No fevers or chills.  No blood in the stool.  No emesis.  States he did have some symptoms like this a few years ago and an ultrasound that sounds as if it is negative.  States he has a history of GERD.  States he previously was on omeprazole but is not currently on it.    Past Medical History:  Diagnosis Date   Anxiety    GERD (gastroesophageal reflux disease)    Hypertension     Patient Active Problem List   Diagnosis Date Noted   Overdose 06/01/2019   Right rib fracture 05/29/2019   Heroin overdose (HCC) 01/23/2019   Rash of hands 01/23/2019   Essential hypertension 01/23/2019   Cellulitis 01/23/2019   Polysubstance abuse (HCC)     Past Surgical History:  Procedure Laterality Date   HAND SURGERY     TONSILLECTOMY         Family History  Family history unknown: Yes    Social History   Tobacco Use   Smoking status: Former    Packs/day: 1.00    Years: 5.00    Pack years: 5.00    Types: Cigarettes   Smokeless tobacco: Never  Vaping Use   Vaping Use: Never used  Substance Use Topics   Alcohol use: Yes    Alcohol/week: 5.0 standard drinks    Types: 5 Cans of beer per week   Drug use: Not Currently    Home Medications Prior to Admission medications   Medication Sig Start Date End Date Taking? Authorizing Provider  Cholecalciferol (VITAMIN D-3) 125  MCG (5000 UT) TABS Take 1 tablet by mouth daily. 06/01/19   Hilts, Casimiro Needle, MD  diclofenac sodium (VOLTAREN) 1 % GEL Apply 2 g topically 4 (four) times daily. 06/30/19   Liberty Handy, PA-C  gabapentin (NEURONTIN) 300 MG capsule Take 300 mg by mouth See admin instructions. Take 300mg  three times daily and 600mg  at bedtime 05/11/19   [provider]  ibuprofen (ADVIL) 400 MG tablet Take 1 tablet (400 mg total) by mouth every 6 (six) hours as needed for mild pain. 01/26/21   07/11/19, MD  lisinopril (ZESTRIL) 20 MG tablet Take 20 mg by mouth daily. 04/08/19   [provider]  Magnesium 400 MG CAPS Take 400 mg by mouth daily. 06/01/19   Hilts, 06/08/19, MD  Menaquinone-7 (VITAMIN K2) 100 MCG CAPS Take 100 mcg by mouth daily. 06/01/19   Hilts, Casimiro Needle, MD  oxyCODONE-acetaminophen (PERCOCET/ROXICET) 5-325 MG tablet Take 1-2 tablets by mouth every 4 (four) hours as needed for severe pain. 01/26/21   Casimiro Needle, MD  pantoprazole (PROTONIX) 40 MG tablet Take 40 mg by mouth daily.  05/04/19   [provider]  PARoxetine (PAXIL) 30 MG tablet Take 30 mg by mouth daily.  04/08/19   [provider]    Allergies    Patient has no known allergies.  Review of Systems   Review of Systems  Constitutional:  Negative for appetite change and fever.  HENT:  Negative for congestion.   Respiratory:  Negative for shortness of breath.   Gastrointestinal:  Positive for abdominal pain. Negative for diarrhea, nausea and vomiting.  Genitourinary:  Negative for genital sores.  Musculoskeletal:  Positive for back pain.  Skin:  Negative for pallor.  Neurological:  Negative for weakness.  Psychiatric/Behavioral:  Negative for confusion.    Physical Exam Updated Vital Signs BP 135/90 (BP Location: Right Arm)   Pulse 68   Temp 98 F (36.7 C) (Oral)   Resp 17   Ht 6\' 2"  (1.88 m)   Wt 76.2 kg   SpO2 100%   BMI 21.57 kg/m   Physical Exam Vitals and nursing note reviewed.  HENT:      Head: Normocephalic.  Cardiovascular:     Rate and Rhythm: Regular rhythm.  Pulmonary:     Effort: Pulmonary effort is normal.  Abdominal:     Tenderness: There is abdominal tenderness.     Hernia: No hernia is present.     Comments: Right upper quadrant tenderness out rebound or guarding.  No hernia palpated.  Skin:    General: Skin is warm.     Capillary Refill: Capillary refill takes less than 2 seconds.  Neurological:     Mental Status: He is alert and oriented to person, place, and time.    ED Results / Procedures / Treatments   Labs (all labs ordered are listed, but only abnormal results are displayed) Labs Reviewed  COMPREHENSIVE METABOLIC PANEL - Abnormal; Notable for the following components:      Result Value   ALT 86 (*)    All other components within normal limits  LIPASE, BLOOD  URINALYSIS, ROUTINE W REFLEX MICROSCOPIC  CBC WITH DIFFERENTIAL/PLATELET    EKG None  Radiology Abdomen Limited RUQ (LIVER/GB)  Result Date: 07/09/2021 CLINICAL DATA:  Right upper quadrant pain EXAM: ULTRASOUND ABDOMEN LIMITED RIGHT UPPER QUADRANT COMPARISON:  None. FINDINGS: Gallbladder: No gallstones or wall thickening visualized. No sonographic Murphy sign noted by sonographer. Common bile duct: Diameter: 1.8 mm Liver: No focal lesion identified. Within normal limits in parenchymal echogenicity. Portal vein is patent on color Doppler imaging with normal direction of blood flow towards the liver. Other: None. IMPRESSION: Negative right upper quadrant abdominal ultrasound Electronically Signed   By: 09/08/2021 M.D.   On: 07/09/2021 21:45    Procedures Procedures   Medications Ordered in ED Medications - No data to display  ED Course  I have reviewed the triage vital signs and the nursing notes.  Pertinent labs & imaging results that were available during my care of the patient were reviewed by me and considered in my medical decision making (see chart for details).     MDM Rules/Calculators/A&P                           Patient abdominal pain.  Right upper quadrant tenderness.  Lab work reassuring with only ALT mildly elevated.  Lipase normal.  Although patient states he does drink a lot of alcohol.  Previous GERD.  With ultrasound reassuring acute cholecystitis felt less likely.  Lab work also reassuring for other causes such as appendicitis or colitis.  Has had previous GERD and reportedly is wondering if it amount alcohol recently.  He will cut back on this and will follow up with GI as needed.  Will discharge home. Final Clinical Impression(s) / ED Diagnoses Final diagnoses:  RUQ pain  Right upper quadrant abdominal pain    Rx / DC Orders ED Discharge Orders     None        Benjiman Core, MD 07/09/21 2339

## 2021-07-09 NOTE — ED Notes (Signed)
Pt ambulatory in ED lobby. 

## 2021-07-09 NOTE — ED Triage Notes (Signed)
Pt c/o RUQ abdominal pain that radiates to his back x 3 days. Also reports pain is sometimes in his right shoulder. Pain worsened after eating. Denies N/V, fevers.

## 2021-07-09 NOTE — ED Notes (Signed)
Pt urine specimen/UA collected, placed in triage urine bin, awaiting order for processing.

## 2021-08-08 ENCOUNTER — Encounter (HOSPITAL_COMMUNITY): Payer: Self-pay

## 2021-08-08 ENCOUNTER — Emergency Department (HOSPITAL_COMMUNITY)
Admission: EM | Admit: 2021-08-08 | Discharge: 2021-08-09 | Disposition: A | Discharge status: Home / Self Care | Payer: Self-pay | Attending: Emergency Medicine | Admitting: Emergency Medicine

## 2021-08-08 ENCOUNTER — Emergency Department (HOSPITAL_COMMUNITY): Payer: Self-pay

## 2021-08-08 ENCOUNTER — Other Ambulatory Visit: Payer: Self-pay

## 2021-08-08 ENCOUNTER — Emergency Department (HOSPITAL_COMMUNITY): Admission: EM | Admit: 2021-08-08 | Discharge: 2021-08-08 | Discharge status: Against Medical Advice | Payer: Self-pay

## 2021-08-08 DIAGNOSIS — Z87891 Personal history of nicotine dependence: Secondary | ICD-10-CM | POA: Insufficient documentation

## 2021-08-08 DIAGNOSIS — I10 Essential (primary) hypertension: Secondary | ICD-10-CM | POA: Insufficient documentation

## 2021-08-08 DIAGNOSIS — Z79899 Other long term (current) drug therapy: Secondary | ICD-10-CM | POA: Insufficient documentation

## 2021-08-08 DIAGNOSIS — Z20822 Contact with and (suspected) exposure to covid-19: Secondary | ICD-10-CM | POA: Insufficient documentation

## 2021-08-08 DIAGNOSIS — R1011 Right upper quadrant pain: Secondary | ICD-10-CM | POA: Insufficient documentation

## 2021-08-08 LAB — LIPASE, BLOOD: Lipase: 34 U/L (ref 11–51)

## 2021-08-08 LAB — CBC
HCT: 44.9 % (ref 39.0–52.0)
Hemoglobin: 15.3 g/dL (ref 13.0–17.0)
MCH: 30.5 pg (ref 26.0–34.0)
MCHC: 34.1 g/dL (ref 30.0–36.0)
MCV: 89.4 fL (ref 80.0–100.0)
Platelets: 176 10*3/uL (ref 150–400)
RBC: 5.02 MIL/uL (ref 4.22–5.81)
RDW: 11.9 % (ref 11.5–15.5)
WBC: 6.2 10*3/uL (ref 4.0–10.5)
nRBC: 0 % (ref 0.0–0.2)

## 2021-08-08 LAB — COMPREHENSIVE METABOLIC PANEL
ALT: 33 U/L (ref 0–44)
AST: 24 U/L (ref 15–41)
Albumin: 4.5 g/dL (ref 3.5–5.0)
Alkaline Phosphatase: 67 U/L (ref 38–126)
Anion gap: 7 (ref 5–15)
BUN: 15 mg/dL (ref 6–20)
CO2: 26 mmol/L (ref 22–32)
Calcium: 9.2 mg/dL (ref 8.9–10.3)
Chloride: 105 mmol/L (ref 98–111)
Creatinine, Ser: 1.05 mg/dL (ref 0.61–1.24)
GFR, Estimated: >60 mL/min (ref >60)
Glucose, Bld: 95 mg/dL (ref 70–99)
Potassium: 3.6 mmol/L (ref 3.5–5.1)
Sodium: 138 mmol/L (ref 135–145)
Total Bilirubin: 0.7 mg/dL (ref 0.3–1.2)
Total Protein: 7.4 g/dL (ref 6.5–8.1)

## 2021-08-08 LAB — RESP PANEL BY RT-PCR (FLU A&B, COVID) ARPGX2
Influenza A by PCR: NEGATIVE
Influenza B by PCR: NEGATIVE
SARS Coronavirus 2 by RT PCR: NEGATIVE

## 2021-08-08 MED ORDER — ONDANSETRON 4 MG PO TBDP: 4.0000 mg | ORAL_TABLET | Freq: Once | ORAL | Status: DC | PRN

## 2021-08-08 NOTE — ED Provider Notes (Signed)
[name]-EMERGENCY DEPT Provider Note   CSN: [id number] Arrival date & time: 08/08/21  2013     History Chief Complaint  Patient presents with   Abdominal Pain    Dalton Johnson is a 25 y.o. male.  The history is provided by the patient and medical records.  Abdominal Pain  25 year old M with hx of anxiety, GERD, HTN, presenting to the ED for right upper quadrant abdominal pain.  He was seen in the ED for same about 1 month ago with negative work-up.  He was referred to GI but states he was not able to follow-up as he does not currently have insurance.  He states of the past 5 days pain has returned and has become more constant.  States it feels like a "knot" in his right upper abdomen.  He has not really noticed any pain with eating or drinking.  He has not had any nausea or vomiting.  No diarrhea or fever.  Does report when he was a child he was told his gallbladder was abnormal.  Also reports his mother had gallbladder issues at a relatively young age.  Past Medical History:  Diagnosis Date   Anxiety    GERD (gastroesophageal reflux disease)    Hypertension     Patient Active Problem List   Diagnosis Date Noted   Overdose 06/01/2019   Right rib fracture 05/29/2019   Heroin overdose (HCC) 01/23/2019   Rash of hands 01/23/2019   Essential hypertension 01/23/2019   Cellulitis 01/23/2019   Polysubstance abuse (HCC)     Past Surgical History:  Procedure Laterality Date   HAND SURGERY     TONSILLECTOMY         Family History  Family history unknown: Yes    Social History   Tobacco Use   Smoking status: Former    Packs/day: 1.00    Years: 5.00    Pack years: 5.00    Types: Cigarettes   Smokeless tobacco: Never  Vaping Use   Vaping Use: Never used  Substance Use Topics   Alcohol use: Yes    Alcohol/week: 5.0 standard drinks    Types: 5 Cans of beer per week   Drug use: Not Currently    Home Medications Prior to Admission  medications   Medication Sig Start Date End Date Taking? Authorizing Provider  Cholecalciferol (VITAMIN D-3) 125 MCG (5000 UT) TABS Take 1 tablet by mouth daily. 06/01/19   Hilts, Casimiro Needle, MD  diclofenac sodium (VOLTAREN) 1 % GEL Apply 2 g topically 4 (four) times daily. 06/30/19   Liberty Handy, PA-C  gabapentin (NEURONTIN) 300 MG capsule Take 300 mg by mouth See admin instructions. Take 300mg  three times daily and 600mg  at bedtime 05/11/19   [provider]  ibuprofen (ADVIL) 400 MG tablet Take 1 tablet (400 mg total) by mouth every 6 (six) hours as needed for mild pain. 01/26/21   07/11/19, MD  lisinopril (ZESTRIL) 20 MG tablet Take 20 mg by mouth daily. 04/08/19   [provider]  Magnesium 400 MG CAPS Take 400 mg by mouth daily. 06/01/19   Hilts, 06/08/19, MD  Menaquinone-7 (VITAMIN K2) 100 MCG CAPS Take 100 mcg by mouth daily. 06/01/19   Hilts, Casimiro Needle, MD  oxyCODONE-acetaminophen (PERCOCET/ROXICET) 5-325 MG tablet Take 1-2 tablets by mouth every 4 (four) hours as needed for severe pain. 01/26/21   Casimiro Needle, MD  pantoprazole (PROTONIX) 40 MG tablet Take 40 mg by mouth daily.  05/04/19  [provider]  PARoxetine (PAXIL) 30 MG tablet Take 30 mg by mouth daily.  04/08/19   [provider]    Allergies    Patient has no known allergies.  Review of Systems   Review of Systems  Gastrointestinal:  Positive for abdominal pain.  All other systems reviewed and are negative.  Physical Exam Updated Vital Signs BP (!) 137/96 (BP Location: Left Arm)   Pulse (!) 56   Temp 98.3 F (36.8 C) (Oral)   Resp 17   Ht 6\' 1"  (1.854 m)   Wt 76.7 kg   SpO2 95%   BMI 22.30 kg/m   Physical Exam Vitals and nursing note reviewed.  Constitutional:      Appearance: He is well-developed.  HENT:     Head: Normocephalic and atraumatic.  Eyes:     Conjunctiva/sclera: Conjunctivae normal.     Pupils: Pupils are equal, round, and reactive to light.   Cardiovascular:     Rate and Rhythm: Normal rate and regular rhythm.     Heart sounds: Normal heart sounds.  Pulmonary:     Effort: Pulmonary effort is normal.     Breath sounds: Normal breath sounds.  Abdominal:     General: Bowel sounds are normal.     Palpations: Abdomen is soft.     Tenderness: There is no abdominal tenderness.     Comments: Endorses pain to RUQ but there is no focal tenderness, no Murphy's sign  Musculoskeletal:        General: Normal range of motion.     Cervical back: Normal range of motion.  Skin:    General: Skin is warm and dry.  Neurological:     Mental Status: He is alert and oriented to person, place, and time.    ED Results / Procedures / Treatments   Labs (all labs ordered are listed, but only abnormal results are displayed) Labs Reviewed  RESP PANEL BY RT-PCR (FLU A&B, COVID) ARPGX2  LIPASE, BLOOD  COMPREHENSIVE METABOLIC PANEL  CBC  URINALYSIS, ROUTINE W REFLEX MICROSCOPIC    EKG None  Radiology No results found.  Procedures Procedures   Medications Ordered in ED Medications  ondansetron (ZOFRAN-ODT) disintegrating tablet 4 mg (has no administration in time range)    ED Course  I have reviewed the triage vital signs and the nursing notes.  Pertinent labs & imaging results that were available during my care of the patient were reviewed by me and considered in my medical decision making (see chart for details).    MDM Rules/Calculators/A&P                           25 y.o. M here with RUQ pain.  Seen for same last month.  States pain worse over the past few days.  He denies nausea, vomiting, diarrhea, fever, chills.  No tenderness on exam.  Labs overall reassuring--LFT's have improved from prior (patient notes he stopped drinking EtOH).  30 remains negative.  UA and covid screen negative as well.  Unclear etiology of his symptoms.  Reports hx of GERD but denies those symptoms currently.  He was referred to GI after prior visit  but did not follow-up due to cost/lack of insurance.  Will set up with wellness clinic so hopefully he can be referred from their office.  May return here for new concerns.  Final Clinical Impression(s) / ED Diagnoses Final diagnoses:  RUQ pain    Rx /  DC Orders ED Discharge Orders     None        Oletha Blend 08/09/21 0017    Jacalyn Lefevre, MD 08/09/21 1745

## 2021-08-08 NOTE — ED Provider Notes (Signed)
Emergency Medicine Provider Triage Evaluation Note  Dalton Johnson II , a 25 y.o. male  was evaluated in triage.  Pt complains of abdominal pain.  Patient was initially evaluated on August 7 for right upper quadrant abdominal pain.  He had a reassuring work-up including an ultrasound of the right upper quadrant and was discharged home in stable condition.  Patient states that he has a history of GERD and used to take omeprazole but discontinued this.  After his recent emergency department visit he then restarted this medication.  States that it initially improved his symptoms but they have persisted for the past month.  No known modifying factors.  He states that today while at work he became flushed, lightheaded, diaphoretic, and felt as if he was going to lose consciousness.  This lasted briefly and then resolved.  Because of his symptoms today as well as persistent abdominal pain he came to the emergency department for reevaluation.  States that he does not have health insurance and has not followed up with gastroenterology due to this.  Of note, patient states he used to drink alcohol regularly but discontinued alcohol use about 1 month ago.  Physical Exam  BP (!) 137/96 (BP Location: Left Arm)   Pulse (!) 56   Temp 98.3 F (36.8 C) (Oral)   Resp 17   Ht 6\' 1"  (1.854 m)   Wt 76.7 kg   SpO2 95%   BMI 22.30 kg/m  Gen:   Awake, no distress   Resp:  Normal effort  MSK:   Moves extremities without difficulty  Other:    Medical Decision Making  Medically screening exam initiated at 9:08 PM.  Appropriate orders placed.  II was informed that the remainder of the evaluation will be completed by another provider, this initial triage assessment does not replace that evaluation, and the importance of remaining in the ED until their evaluation is complete.   Dalton Roys, PA-C 08/08/21 2110    2111, MD 08/09/21 1025

## 2021-08-08 NOTE — ED Triage Notes (Signed)
Pt complains of RUQ abdominal pain x 1 month.

## 2021-08-09 LAB — URINALYSIS, ROUTINE W REFLEX MICROSCOPIC
Bilirubin Urine: NEGATIVE
Glucose, UA: NEGATIVE mg/dL
Hgb urine dipstick: NEGATIVE
Ketones, ur: NEGATIVE mg/dL
Leukocytes,Ua: NEGATIVE
Nitrite: NEGATIVE
Protein, ur: NEGATIVE mg/dL
Specific Gravity, Urine: >1.03 — ABNORMAL HIGH (ref 1.005–1.030)
pH: 6 (ref 5.0–8.0)

## 2021-08-09 NOTE — Discharge Instructions (Addendum)
Call the wellness clinic in the morning to get established.  Once you see the doctor there, they can likely get you set up with GI. Return here for new concerns.

## 2021-09-29 NOTE — Progress Notes (Signed)
Subjective:    Dalton Johnson - 25 y.o. male MRN [id number]  Date of birth: [birth year]  HPI  Dalton Johnson is to establish care.   Current issues and/or concerns: HOSPITAL DISCHARGE FOLLOW-UP: [facility] Emergency Department 08/08/2021 - 08/09/2021 per MD note: 25 y.o. M here with RUQ pain.  Seen for same last month.  States pain worse over the past few days.  He denies nausea, vomiting, diarrhea, fever, chills.  No tenderness on exam.  Labs overall reassuring--LFT's have improved from prior (patient notes he stopped drinking EtOH).  [place] remains negative.  UA and covid screen negative as well.  Unclear etiology of his symptoms.  Reports hx of GERD but denies those symptoms currently.  He was referred to GI after prior visit but did not follow-up due to cost/lack of insurance.  Will set up with wellness clinic so hopefully he can be referred from their office.  May return here for new concerns.  10/03/2021: Doing well on Pantoprazole. Still having right upper abdominal pain. No recent nausea/vomiting or additional red flag symptoms. Would like to continue with referral to Gastroenterology now that has health insurance.   2. NEUROPATHIC PAIN: Reports history of substance abuse. States he is all better now. 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 Also has flares  of eczema of bilateral feet. Taking Gabapentin helps both. Requesting prescriptions today are printed because he uses [facility] and unsure of which pharmacy he will use.    ROS per HPI     Health Maintenance:  Health Maintenance Due  Topic Date Due   COVID-19 Vaccine (1) Never done   HPV VACCINES (1 - Male 2-dose series) Never done   Hepatitis C Screening  Never done     Past Medical History: Patient Active Problem List   Diagnosis Date Noted   Overdose 06/01/2019   Right rib fracture 05/29/2019   Heroin overdose (HCC) 01/23/2019   Rash of hands 01/23/2019   Essential  hypertension 01/23/2019   Cellulitis 01/23/2019   Polysubstance abuse (HCC)       Social History   reports that he has quit smoking. His smoking use included cigarettes. He has a 5.00 pack-year smoking history. He has never used smokeless tobacco. He reports current alcohol use of about 5.0 standard drinks per week. He reports that he does not currently use drugs.   Family History  Family history is unknown by patient.   Medications: reviewed and updated   Objective:   Physical Exam BP 121/75 (BP Location: Left Arm, Patient Position: Sitting, Cuff Size: Normal)   Pulse (!) 52   Resp 16   Ht 6' 1.5" (1.867 m)   Wt 157 lb (71.2 kg)   SpO2 98%   BMI 20.43 kg/m   Physical Exam HENT:     Head: Normocephalic and atraumatic.  Eyes:     Extraocular Movements: Extraocular movements intact.     Conjunctiva/sclera: Conjunctivae normal.     Pupils: Pupils are equal, round, and reactive to light.  Cardiovascular:     Rate and Rhythm: Bradycardia present.     Pulses: Normal pulses.     Heart sounds: Normal heart sounds.  Pulmonary:     Effort: Pulmonary effort is normal.     Breath sounds: Normal breath sounds.  Musculoskeletal:     Cervical back: Normal range of motion and neck supple.  Neurological:     General: No focal deficit present.     Mental  Status: He is alert and oriented to person, place, and time.  Psychiatric:        Mood and Affect: Mood normal.        Behavior: Behavior normal.       Assessment & Plan:  1. Encounter to establish care: - Patient presents today to establish care.  - Return for annual physical examination, labs, and health maintenance. Arrive fasting meaning having no food for at least 8 hours prior to appointment. You may have only water or black coffee. Please take scheduled medications as normal.  2. Hospital discharge follow-up: 3. Gastroesophageal reflux disease, unspecified whether esophagitis present: - Reviewed hospital course, current  medications, ensured proper follow-up in place, and addressed concerns.  - Continue Pantoprazole as prescribed. - Referral to Gastroenterology for further evaluation and management.  - Follow-up with primary provider as scheduled.  - Ambulatory referral to Gastroenterology - pantoprazole (PROTONIX) 40 MG tablet; Take 1 tablet (40 mg total) by mouth daily.  Dispense: 90 tablet; Refill: 0  4. Neuropathic pain of both feet: - Continue Gabapentin as prescribed.  - Follow-up with primary provider as scheduled.  - gabapentin (NEURONTIN) 300 MG capsule; Take 1 capsule (300 mg total) by mouth See admin instructions. Take 300mg  (1 tablet total) three times daily and 600 mg (2 tablets total) at bedtime.  Dispense: 150 capsule; Refill: 1      Patient was given clear instructions to go to Emergency Department or return to medical center if symptoms don't improve, worsen, or new problems develop.The patient verbalized understanding.  I discussed the assessment and treatment plan with the patient. The patient was provided an opportunity to ask questions and all were answered. The patient agreed with the plan and demonstrated an understanding of the instructions.   The patient was advised to call back or seek an in-person evaluation if the symptoms worsen or if the condition fails to improve as anticipated.    , NP 10/03/2021, 9:07 AM Primary Care at [facility]

## 2021-10-03 ENCOUNTER — Other Ambulatory Visit: Payer: Self-pay

## 2021-10-03 ENCOUNTER — Ambulatory Visit (INDEPENDENT_AMBULATORY_CARE_PROVIDER_SITE_OTHER): Payer: Self-pay | Admitting: Family

## 2021-10-03 ENCOUNTER — Encounter: Payer: Self-pay | Admitting: Family

## 2021-10-03 VITALS — BP 121/75 | HR 52 | Resp 16 | Ht 73.5 in | Wt 157.0 lb

## 2021-10-03 DIAGNOSIS — Z7689 Persons encountering health services in other specified circumstances: Secondary | ICD-10-CM

## 2021-10-03 DIAGNOSIS — K219 Gastro-esophageal reflux disease without esophagitis: Secondary | ICD-10-CM

## 2021-10-03 DIAGNOSIS — G5793 Unspecified mononeuropathy of bilateral lower limbs: Secondary | ICD-10-CM

## 2021-10-03 DIAGNOSIS — Z09 Encounter for follow-up examination after completed treatment for conditions other than malignant neoplasm: Secondary | ICD-10-CM

## 2021-10-03 MED ORDER — GABAPENTIN 300 MG PO CAPS
300.0000 mg | ORAL_CAPSULE | ORAL | 1 refills | Status: DC
Start: 2021-10-03 — End: 2021-11-17

## 2021-10-03 MED ORDER — PANTOPRAZOLE SODIUM 40 MG PO TBEC
40.0000 mg | DELAYED_RELEASE_TABLET | Freq: Every day | ORAL | 0 refills | Status: DC
Start: 2021-10-03 — End: 2021-11-06

## 2021-10-03 NOTE — Patient Instructions (Addendum)
Thank you for choosing Primary Care at [facility] for your medical home!    Dalton Johnson was seen by Rema Fendt, NP today.   Dalton Johnson's primary care provider is Ricky Stabs, NP.   For the best care possible,  you should try to see Ricky Stabs, NP whenever you come to clinic.   We look forward to seeing you again soon!  If you have any questions about your visit today,  please call us at [phone]  Or feel free to reach your provider via MyChart.    Keeping you healthy   Get these tests Blood pressure- Have your blood pressure checked once a year by your healthcare provider.  Normal blood pressure is 120/80. Weight- Have your body mass index (BMI) calculated to screen for obesity.  BMI is a measure of body fat based on height and weight. You can also calculate your own BMI at https://www.west-esparza.com/. Cholesterol- Have your cholesterol checked regularly starting at age 86, sooner may be necessary if you have diabetes, high blood pressure, if a family member developed heart diseases at an early age or if you smoke.  Chlamydia, HIV, and other sexual transmitted disease- Get screened each year until the age of 55 then within three months of each new sexual partner. Diabetes- Have your blood sugar checked regularly if you have high blood pressure, high cholesterol, a family history of diabetes or if you are overweight.   Get these vaccines Flu shot- Every fall. Tetanus shot- Every 10 years. Menactra- Single dose; prevents meningitis.   Take these steps Don't smoke- If you do smoke, ask your healthcare provider about quitting. For tips on how to quit, go to www.smokefree.gov or call 1-800-QUIT-NOW. Be physically active- Exercise 5 days a week for at least 30 minutes.  If you are not already physically active start slow and gradually work up to 30 minutes of moderate physical activity.  Examples of moderate activity include walking briskly, mowing the yard,  dancing, swimming bicycling, etc. Eat a healthy diet- Eat a variety of healthy foods such as fruits, vegetables, low fat milk, low fat cheese, yogurt, lean meats, poultry, fish, beans, tofu, etc.  For more information on healthy eating, go to www.thenutritionsource.org Drink alcohol in moderation- Limit alcohol intake two drinks or less a day.  Never drink and drive. Dentist- Brush and floss teeth twice daily; visit your dentis twice a year. Depression-Your emotional health is as important as your physical health.  If you're feeling down, losing interest in things you normally enjoy please talk with your healthcare provider. Gun Safety- If you keep a gun in your home, keep it unloaded and with the safety lock on.  Bullets should be stored separately. Helmet use- Always wear a helmet when riding a motorcycle, bicycle, rollerblading or skateboarding. Safe sex- If you may be exposed to a sexually transmitted infection, use a condom Seat belts- Seat bels can save your life; always wear one. Smoke/Carbon Monoxide detectors- These detectors need to be installed on the appropriate level of your home.  Replace batteries at least once a year. Skin Cancer- When out in the sun, cover up and use sunscreen SPF 15 or higher. Violence- If anyone is threatening or hurting you, please tell your healthcare provider.

## 2021-10-03 NOTE — Progress Notes (Signed)
Establish care- Has intermittent right side pain x 2 mos.

## 2021-10-18 ENCOUNTER — Encounter: Payer: Self-pay | Admitting: Internal Medicine

## 2021-10-31 ENCOUNTER — Other Ambulatory Visit: Payer: Self-pay | Admitting: Family

## 2021-10-31 DIAGNOSIS — G5793 Unspecified mononeuropathy of bilateral lower limbs: Secondary | ICD-10-CM

## 2021-11-02 NOTE — Telephone Encounter (Signed)
Patient prescribed 60 day supply of Gabapentin (Neurontin) on 10/03/2021. Patient should have enough medication to last through 12/02/2021 at which time he should follow-up with appointment for additional refills.

## 2021-11-06 ENCOUNTER — Ambulatory Visit (INDEPENDENT_AMBULATORY_CARE_PROVIDER_SITE_OTHER): Payer: Self-pay | Admitting: Internal Medicine

## 2021-11-06 ENCOUNTER — Encounter: Payer: Self-pay | Admitting: Internal Medicine

## 2021-11-06 DIAGNOSIS — R110 Nausea: Secondary | ICD-10-CM

## 2021-11-06 DIAGNOSIS — Z87898 Personal history of other specified conditions: Secondary | ICD-10-CM

## 2021-11-06 DIAGNOSIS — K219 Gastro-esophageal reflux disease without esophagitis: Secondary | ICD-10-CM

## 2021-11-06 DIAGNOSIS — R1011 Right upper quadrant pain: Secondary | ICD-10-CM

## 2021-11-06 MED ORDER — PANTOPRAZOLE SODIUM 40 MG PO TBEC: 40.0000 mg | DELAYED_RELEASE_TABLET | Freq: Two times a day (BID) | ORAL | 1 refills | Status: AC

## 2021-11-06 NOTE — Patient Instructions (Addendum)
If you are age 26 or younger, your body mass index should be between 19-25. Your Body mass index is 20.95 kg/m. If this is out of the aformentioned range listed, please consider follow up with your Primary Care Provider.   ________________________________________________________  The [place] [facility] providers would like to encourage you to use [facility] to communicate with providers for non-urgent requests or questions.  Due to long hold times on the telephone, sending your provider a message by [facility] may be a faster and more efficient way to get a response.  Please allow 48 business hours for a response.  Please remember that this is for non-urgent requests.  _______________________________________________________  [name] have been scheduled for an endoscopy. Please follow written instructions given to you at your visit today. If you use inhalers (even only as needed), please bring them with you on the day of your procedure.  You have been scheduled for a CT scan of the abdomen and pelvis at [facility]. You are scheduled on 11-13-21  at 8:00am. You should arrive 30 minutes prior to your appointment time for registration.  If you have any questions regarding your exam or if you need to reschedule, you may call [facility] at [phone] between the hours of 8:00 am and 5:00 pm, Monday-Friday.   Due to recent changes in healthcare laws, you may see the results of your imaging and laboratory studies on MyChart before your provider has had a chance to review them.  We understand that in some cases there may be results that are confusing or concerning to you. Not all laboratory results come back in the same time frame and the provider may be waiting for multiple results in order to interpret others.  Please give [place] 48 hours in order for your provider to thoroughly review all the results before contacting the office for clarification of your results.   We have sent the following medications to your  pharmacy for you to pick up at your convenience:  INCREASE: pantoprazole 40mg  to one tablet twice daily.  Thank you for entrusting me with your care and choosing [facility].  Dr [facility]

## 2021-11-06 NOTE — Progress Notes (Signed)
Chief Complaint: RUQ ab pain  HPI : 25 year old male with history of GERD and anxiety presents with RUQ ab pain.  He has had ab pain in the RUQ that has been constant but varies in severity for the last few months. Certain foods such as spicy foods or carbonated beverages can make the pain worse. Ab pain feels like dull and pressure. He has issues with GERD that have been longstanding. He takes pantoprazole 40 mg QD that appears to help with his GERD, but he is not sure if the PPI therapy helps with his abdominal pain. Denies vomiting, diarrhea, constipation, melena, hematochezia. He will be nauseous when he is hungry. He has lost a little bit of weight recently, maybe about 10 lbs. Denies prior EGD. He used to be heavy drinker with about 12 pack of beer per day but has significantly cut down. He still drinks alcohol occasionally.  Mother has history of GERD, gallstones, and IBS. No other family history of GI issues. Denies prior abdominal surgeries He did have seasonal allergies and asthma when he was a kid. Denies significant NSAID use.  Wt Readings from Last 3 Encounters:  11/06/21 161 lb (73 kg)  10/03/21 157 lb (71.2 kg)  08/08/21 169 lb (76.7 kg)    Past Medical History:  Diagnosis Date   Anxiety    GERD (gastroesophageal reflux disease)    Hypertension      Past Surgical History:  Procedure Laterality Date   HAND SURGERY     TONSILLECTOMY     Family History  Family history unknown: Yes   Social History   Tobacco Use   Smoking status: Former    Packs/day: 1.00    Years: 5.00    Pack years: 5.00    Types: Cigarettes   Smokeless tobacco: Never  Vaping Use   Vaping Use: Never used  Substance Use Topics   Alcohol use: Yes    Alcohol/week: 5.0 standard drinks    Types: 5 Cans of beer per week   Drug use: Not Currently   Current Outpatient Medications  Medication Sig Dispense Refill   gabapentin (NEURONTIN) 300 MG capsule Take 1 capsule (300 mg total) by mouth See  admin instructions. Take 300mg  (1 tablet total) three times daily and 600 mg (2 tablets total) at bedtime. 150 capsule 1   pantoprazole (PROTONIX) 40 MG tablet Take 1 tablet (40 mg total) by mouth 2 (two) times daily. 180 tablet 1   PARoxetine (PAXIL) 30 MG tablet Take 30 mg by mouth daily.     No current facility-administered medications for this visit.   No Known Allergies   Review of Systems: All systems reviewed and negative except where noted in HPI.   Physical Exam: BP 104/60   Pulse 80   Ht 6' 1.5" (1.867 m)   Wt 161 lb (73 kg)   BMI 20.95 kg/m  Constitutional: Pleasant,well-developed, male in no acute distress. HEENT: Normocephalic and atraumatic. Conjunctivae are normal. No scleral icterus. Cardiovascular: Normal rate, regular rhythm.  Pulmonary/chest: Effort normal and breath sounds normal. No wheezing, rales or rhonchi. Abdominal: Soft, nondistended, tender in the epigastric and RUQ. Bowel sounds active throughout. There are no masses palpable. No hepatomegaly. Extremities: No edema Neurological: Alert and oriented to person place and time. Skin: Skin is warm and dry. No rashes noted. Psychiatric: Normal mood and affect. Behavior is normal.  Labs 08/2021: CMP, CBC, and lipase unremarkable  RUQ U/S 07/09/21: IMPRESSION: Negative right upper quadrant abdominal ultrasound  RUQ U/S 08/08/21: IMPRESSION: Normal right upper quadrant ultrasound.  ASSESSMENT AND PLAN:  RUQ ab pain Nausea GERD History of alcohol use Patient presents with issues with right upper quadrant abdominal pain and GERD.  His pain does appear to be affected by intake of certain foods.  Patient has a history of significant alcohol use but denies overt pancreatitis.  He has had several ultrasounds done in the past that have been negative for cholecystitis or gallstones.  At this time differential includes GERD, chronic pancreatitis, PUD, gastritis/duodenitis, or esophagitis.  Will have the patient try  to avoid reflux-inducing foods, increase his PPI therapy, obtain CT pancreas protocol to evaluate for chronic pancreatitis, and plan for EGD [facility] to evaluate for PUD. - Avoid GERD producing foods - Increase PPI from QD to BID - CT pancreas protocol - EGD [facility] - RTC 2 months  [name], MD

## 2021-11-10 ENCOUNTER — Ambulatory Visit (AMBULATORY_SURGERY_CENTER): Payer: Self-pay | Admitting: Internal Medicine

## 2021-11-10 ENCOUNTER — Encounter: Payer: Self-pay | Admitting: Internal Medicine

## 2021-11-10 ENCOUNTER — Other Ambulatory Visit: Payer: Self-pay

## 2021-11-10 VITALS — BP 130/72 | HR 44 | Temp 98.6°F | Resp 18 | Ht 73.0 in | Wt 161.0 lb

## 2021-11-10 DIAGNOSIS — K2970 Gastritis, unspecified, without bleeding: Secondary | ICD-10-CM

## 2021-11-10 DIAGNOSIS — R1011 Right upper quadrant pain: Secondary | ICD-10-CM

## 2021-11-10 MED ORDER — SODIUM CHLORIDE 0.9 % IV SOLN
500.0000 mL | Freq: Once | INTRAVENOUS | Status: DC
Start: 2021-11-10 — End: 2021-11-10

## 2021-11-10 NOTE — Patient Instructions (Signed)
Take your pantoprazole twice daily on an empty stomach.  Read all handouts given to you by your recovery room nurse.  YOU HAD AN ENDOSCOPIC PROCEDURE TODAY AT THE [facility]:   Refer to the procedure report that was given to you for any specific questions about what was found during the examination.  If the procedure report does not answer your questions, please call your gastroenterologist to clarify.  If you requested that your care partner not be given the details of your procedure findings, then the procedure report has been included in a sealed envelope for you to review at your convenience later.  YOU SHOULD EXPECT: Some feelings of bloating in the abdomen. Passage of more gas than usual.  Walking can help get rid of the air that was put into your GI tract during the procedure and reduce the bloating. If you had a lower endoscopy (such as a colonoscopy or flexible sigmoidoscopy) you may notice spotting of blood in your stool or on the toilet paper. If you underwent a bowel prep for your procedure, you may not have a normal bowel movement for a few days.  Please Note:  You might notice some irritation and congestion in your nose or some drainage.  This is from the oxygen used during your procedure.  There is no need for concern and it should clear up in a day or so.  SYMPTOMS TO REPORT IMMEDIATELY:   Following upper endoscopy (EGD)  Vomiting of blood or coffee ground material  New chest pain or pain under the shoulder blades  Painful or persistently difficult swallowing  New shortness of breath  Fever of 100F or higher  Black, tarry-looking stools  For urgent or emergent issues, a gastroenterologist can be reached at any hour by calling [phone]. Do not use MyChart messaging for urgent concerns.    DIET:  We do recommend a small meal at first, but then you may proceed to your regular diet.  Drink plenty of fluids but you should avoid alcohol.  ACTIVITY:  You should  plan to take it easy for the rest of today and you should NOT DRIVE or use heavy machinery until tomorrow (because of the sedation medicines used during the test).    FOLLOW UP: Our staff will call the number listed on your records 48-72 hours following your procedure to check on you and address any questions or concerns that you may have regarding the information given to you following your procedure. If we do not reach you, we will leave a message.  We will attempt to reach you two times.  During this call, we will ask if you have developed any symptoms of COVID 19. If you develop any symptoms (ie: fever, flu-like symptoms, shortness of breath, cough etc.) before then, please call [phone].  If you test positive for Covid 19 in the 2 weeks post procedure, please call and report this information to [place].    If any biopsies were taken you will be contacted by phone or by letter within the next 1-3 weeks.  Please call us at [phone] if you have not heard about the biopsies in 3 weeks.    SIGNATURES/CONFIDENTIALITY: You and/or your care partner have signed paperwork which will be entered into your electronic medical record.  These signatures attest to the fact that that the information above on your After Visit Summary has been reviewed and is understood.  Full responsibility of the confidentiality of this discharge information lies with  you and/or your care-partner.

## 2021-11-10 NOTE — Progress Notes (Signed)
GASTROENTEROLOGY PROCEDURE H&P NOTE   Primary Care Physician: Rema Fendt, NP    Reason for Procedure:   RUQ ab pain  Plan:    EGD  Patient is appropriate for endoscopic procedure(s) in the ambulatory ([facility]) setting.  The nature of the procedure, as well as the risks, benefits, and alternatives were carefully and thoroughly reviewed with the patient. Ample time for discussion and questions allowed. The patient understood, was satisfied, and agreed to proceed.     HPI: Dalton Johnson is a 25 y.o. male who presents for EGD for evaluation of RUQ ab pain .  Patient was most recently seen in the Gastroenterology Clinic on 11/06/21.  No interval change in medical history since that appointment. Please refer to that note for full details regarding GI history and clinical presentation.   Past Medical History:  Diagnosis Date   Anxiety    GERD (gastroesophageal reflux disease)    Hypertension     Past Surgical History:  Procedure Laterality Date   HAND SURGERY     TONSILLECTOMY      Prior to Admission medications   Medication Sig Start Date End Date Taking? Authorizing Provider  gabapentin (NEURONTIN) 300 MG capsule Take 1 capsule (300 mg total) by mouth See admin instructions. Take 300mg  (1 tablet total) three times daily and 600 mg (2 tablets total) at bedtime. 10/03/21  Yes 13/1/22, Amy J, NP  pantoprazole (PROTONIX) 40 MG tablet Take 1 tablet (40 mg total) by mouth 2 (two) times daily. 11/06/21 02/04/22 Yes 04/06/22, MD  PARoxetine (PAXIL) 30 MG tablet Take 30 mg by mouth daily. Patient not taking: Reported on 11/10/2021 04/08/19   [provider]    Current Outpatient Medications  Medication Sig Dispense Refill   gabapentin (NEURONTIN) 300 MG capsule Take 1 capsule (300 mg total) by mouth See admin instructions. Take 300mg  (1 tablet total) three times daily and 600 mg (2 tablets total) at bedtime. 150 capsule 1   pantoprazole (PROTONIX) 40 MG tablet Take 1  tablet (40 mg total) by mouth 2 (two) times daily. 180 tablet 1   PARoxetine (PAXIL) 30 MG tablet Take 30 mg by mouth daily. (Patient not taking: Reported on 11/10/2021)     Current Facility-Administered Medications  Medication Dose Route Frequency Provider Last Rate Last Admin   0.9 %  sodium chloride infusion  500 mL Intravenous Once , MD        Allergies as of 11/10/2021   (No Known Allergies)    Family History  Family history unknown: Yes    Social History   Socioeconomic History   Marital status: Single    Spouse name: Not on file   Number of children: Not on file   Years of education: Not on file   Highest education level: Not on file  Occupational History   Not on file  Tobacco Use   Smoking status: Former    Packs/day: 1.00    Years: 5.00    Pack years: 5.00    Types: Cigarettes   Smokeless tobacco: Never  Vaping Use   Vaping Use: Some days  Substance and Sexual Activity   Alcohol use: Yes    Alcohol/week: 5.0 standard drinks    Types: 5 Cans of beer per week   Drug use: Not Currently   Sexual activity: Yes    Birth control/protection: None  Other Topics Concern   Not on file  Social History Narrative   Not on  file   Social Determinants of Health   Financial Resource Strain: Not on file  Food Insecurity: Not on file  Transportation Needs: Not on file  Physical Activity: Not on file  Stress: Not on file  Social Connections: Not on file  Intimate Partner Violence: Not on file    Physical Exam: Vital signs in last 24 hours: BP 102/62   Pulse 60   Temp 98.6 F (37 C)   Ht 6\' 1"  (1.854 m)   Wt 161 lb (73 kg)   SpO2 100%   BMI 21.24 kg/m  GEN: NAD EYE: Sclerae anicteric ENT: MMM CV: Non-tachycardic Pulm: No increased WOB GI: Soft NEURO:  Alert & Oriented   , MD [facility]   11/10/2021 10:53 AM

## 2021-11-10 NOTE — Progress Notes (Signed)
Pt's states no medical or surgical changes since previsit or office visit. VS by DT. °

## 2021-11-10 NOTE — Progress Notes (Signed)
Sedate, gd SR, tolerated procedure well, VSS, report to RN 

## 2021-11-10 NOTE — Op Note (Signed)
[facility] Patient Name: Dalton Johnson Procedure Date: 11/10/2021 10:59 AM MRN: [id number] Endoscopist: Nicole Kindred "Dalton Johnson ,  Age: 25 Referring MD:  Date of Birth: 09/06/1996 Gender: Male Account #: [id number] Procedure:                Upper GI endoscopy Indications:              Abdominal pain in the right upper quadrant Medicines:                Monitored Anesthesia Care Procedure:                Pre-Anesthesia Assessment:                           - Prior to the procedure, a History and Physical                            was performed, and patient medications and                            allergies were reviewed. The patient's tolerance of                            previous anesthesia was also reviewed. The risks                            and benefits of the procedure and the sedation                            options and risks were discussed with the patient.                            All questions were answered, and informed consent                            was obtained. Prior Anticoagulants: The patient has                            taken no previous anticoagulant or antiplatelet                            agents. ASA Grade Assessment: II - A patient with                            mild systemic disease. After reviewing the risks                            and benefits, the patient was deemed in                            satisfactory condition to undergo the procedure.                           After obtaining informed consent, the endoscope was  passed under direct vision. Throughout the                            procedure, the patient's blood pressure, pulse, and                            oxygen saturations were monitored continuously. The                            GIF [id number] #[id number] was introduced through the                            mouth, and advanced to the second part of duodenum.                            The upper  GI endoscopy was accomplished without                            difficulty. The patient tolerated the procedure                            well. Scope In: Scope Out: Findings:                 The examined esophagus was normal. Biopsies were                            taken with a cold forceps for histology.                           Localized inflammation characterized by adherent                            blood, congestion (edema) and erythema was found in                            the gastric body and in the gastric antrum.                            Biopsies were taken with a cold forceps for                            histology.                           The examined duodenum was normal. Biopsies were                            taken with a cold forceps for histology. Complications:            No immediate complications. Estimated Blood Loss:     Estimated blood loss was minimal. Impression:               - Normal esophagus. Biopsied.                           -  Gastritis. Biopsied.                           - Normal examined duodenum. Biopsied. Recommendation:           - Discharge patient to home (with escort).                           - Continue pantoprazole twice daily.                           - Await pathology results.                           - The findings and recommendations were discussed                            with the patient. Nicole Kindred "Dalton Johnson,  11/10/2021 11:16:28 AM

## 2021-11-10 NOTE — Progress Notes (Signed)
Called to room to assist during endoscopic procedure.  Patient ID and intended procedure confirmed with present staff. Received instructions for my participation in the procedure from the performing physician.  

## 2021-11-12 NOTE — Progress Notes (Signed)
Patient ID: Dalton Johnson, male    DOB: 1996/08/04  MRN: [id number]  CC: Annual Physical Exam   Subjective: Dalton Johnson is a 25 y.o. male who presents for annual physical exam.  His concerns today include:  Reports taking Lunesta in the past helped with insomnia which is secondary to anxiety. Reports he does not want to take anxiety medication and quit taking Paxil in the past because of side effects. Has tried other insomnia medications such as Trazodone and did not help. Not ready for referral to Sleep Medicine.    Patient Active Problem List   Diagnosis Date Noted   Overdose 06/01/2019   Right rib fracture 05/29/2019   Heroin overdose (HCC) 01/23/2019   Rash of hands 01/23/2019   Essential hypertension 01/23/2019   Cellulitis 01/23/2019   Polysubstance abuse (HCC)      Current Outpatient Medications on File Prior to Visit  Medication Sig Dispense Refill   pantoprazole (PROTONIX) 40 MG tablet Take 1 tablet (40 mg total) by mouth 2 (two) times daily. 180 tablet 1   PARoxetine (PAXIL) 30 MG tablet Take 30 mg by mouth daily. (Patient not taking: Reported on 11/10/2021)     No current facility-administered medications on file prior to visit.    No Known Allergies  Social History   Socioeconomic History   Marital status: Single    Spouse name: Not on file   Number of children: Not on file   Years of education: Not on file   Highest education level: Not on file  Occupational History   Not on file  Tobacco Use   Smoking status: Former    Packs/day: 1.00    Years: 5.00    Pack years: 5.00    Types: Cigarettes   Smokeless tobacco: Never  Vaping Use   Vaping Use: Some days  Substance and Sexual Activity   Alcohol use: Yes    Alcohol/week: 5.0 standard drinks    Types: 5 Cans of beer per week   Drug use: Not Currently   Sexual activity: Yes    Birth control/protection: None  Other Topics Concern   Not on file  Social History Narrative   Not on file    Social Determinants of Health   Financial Resource Strain: Not on file  Food Insecurity: Not on file  Transportation Needs: Not on file  Physical Activity: Not on file  Stress: Not on file  Social Connections: Not on file  Intimate Partner Violence: Not on file    Family History  Family history unknown: Yes    Past Surgical History:  Procedure Laterality Date   HAND SURGERY     TONSILLECTOMY      ROS: Review of Systems Negative except as stated above  PHYSICAL EXAM: BP 119/76 (BP Location: Left Arm, Patient Position: Sitting, Cuff Size: Normal)   Pulse 70   Temp 98.3 F (36.8 C)   Resp 18   Ht 6' 1.5" (1.867 m)   Wt 159 lb (72.1 kg)   SpO2 96%   BMI 20.69 kg/m    Physical Exam HENT:     Head: Normocephalic and atraumatic.     Right Ear: Tympanic membrane, ear canal and external ear normal.     Left Ear: Tympanic membrane, ear canal and external ear normal.     Nose: Nose normal.     Mouth/Throat:     Mouth: Mucous membranes are moist.     Pharynx: Oropharynx is clear.  Eyes:  Extraocular Movements: Extraocular movements intact.     Conjunctiva/sclera: Conjunctivae normal.     Pupils: Pupils are equal, round, and reactive to light.  Cardiovascular:     Rate and Rhythm: Normal rate and regular rhythm.     Pulses: Normal pulses.     Heart sounds: Normal heart sounds.  Pulmonary:     Effort: Pulmonary effort is normal.     Breath sounds: Normal breath sounds.  Abdominal:     General: Bowel sounds are normal.     Palpations: Abdomen is soft.  Genitourinary:    Comments: Patient declined exam. Musculoskeletal:        General: Normal range of motion.     Cervical back: Normal range of motion and neck supple.  Skin:    General: Skin is warm and dry.     Capillary Refill: Capillary refill takes less than 2 seconds.  Neurological:     General: No focal deficit present.     Mental Status: He is alert and oriented to person, place, and time.   Psychiatric:        Mood and Affect: Mood normal.        Behavior: Behavior normal.   ASSESSMENT AND PLAN: 1. Annual physical exam: - Counseled on 150 minutes of exercise per week as tolerated, healthy eating (including decreased daily intake of saturated fats, cholesterol, added sugars, sodium), STI prevention, and routine healthcare maintenance.  2. Screening for metabolic disorder: - CMP last obtained 08/08/2021.  3. Screening for deficiency anemia: - CBC last obtained 08/08/2021.  4. Diabetes mellitus screening: - Hemoglobin A1c to screen for pre-diabetes/diabetes. - Hemoglobin A1c  5. Screening cholesterol level: - Lipid panel to screen for high cholesterol.  - Lipid panel  6. Thyroid disorder screen: - TSH to check thyroid function.  - TSH  7. Need for hepatitis C screening test: - Hepatitis C antibody to screen for hepatitis C.  - Hepatitis C Antibody  8. Neuropathic pain of both feet: - Continue Gabapentin as prescribed.  - Follow-up with primary provider as scheduled.  - gabapentin (NEURONTIN) 300 MG capsule; Take 1 capsule (300 mg total) by mouth See admin instructions. Take 300mg  (1 tablet total) three times daily and 600 mg (2 tablets total) at bedtime.  Dispense: 150 capsule; Refill: 1  9. Gastroesophageal reflux disease, unspecified whether esophagitis present: - Keep all scheduled appointments with Gastroenterology.   10. Insomnia, unspecified type: - Counseled patient I do not prescribe hypnotics such as requested Eszopiclone.  - Patient declined referral to Sleep Medicine.  - Follow-up with primary provider as scheduled.   Patient was given the opportunity to ask questions.  Patient verbalized understanding of the plan and was able to repeat key elements of the plan. Patient was given clear instructions to go to Emergency Department or return to medical center if symptoms don't improve, worsen, or new problems develop.The patient verbalized  understanding.   Orders Placed This Encounter  Procedures   Hepatitis C Antibody   Lipid panel   TSH   Hemoglobin A1c     Requested Prescriptions   Signed Prescriptions Disp Refills   gabapentin (NEURONTIN) 300 MG capsule 150 capsule 1    Sig: Take 1 capsule (300 mg total) by mouth See admin instructions. Take 300mg  (1 tablet total) three times daily and 600 mg (2 tablets total) at bedtime.    Return in about 1 year (around 11/17/2022) for Physical per patient preference.  , NP

## 2021-11-13 ENCOUNTER — Encounter (HOSPITAL_BASED_OUTPATIENT_CLINIC_OR_DEPARTMENT_OTHER): Payer: Self-pay

## 2021-11-13 ENCOUNTER — Other Ambulatory Visit: Payer: Self-pay

## 2021-11-13 ENCOUNTER — Ambulatory Visit (HOSPITAL_BASED_OUTPATIENT_CLINIC_OR_DEPARTMENT_OTHER)
Admission: RE | Admit: 2021-11-13 | Discharge: 2021-11-13 | Disposition: A | Discharge status: Home / Self Care | Payer: Self-pay | Source: Ambulatory Visit | Attending: Internal Medicine | Admitting: Internal Medicine

## 2021-11-13 DIAGNOSIS — Z87898 Personal history of other specified conditions: Secondary | ICD-10-CM | POA: Insufficient documentation

## 2021-11-13 DIAGNOSIS — K219 Gastro-esophageal reflux disease without esophagitis: Secondary | ICD-10-CM | POA: Insufficient documentation

## 2021-11-13 DIAGNOSIS — R110 Nausea: Secondary | ICD-10-CM | POA: Insufficient documentation

## 2021-11-13 DIAGNOSIS — R1011 Right upper quadrant pain: Secondary | ICD-10-CM | POA: Insufficient documentation

## 2021-11-13 MED ORDER — IOHEXOL 300 MG/ML  SOLN
75.0000 mL | Freq: Once | INTRAMUSCULAR | Status: AC | PRN
  Administered 2021-11-13: 75 mL via INTRAVENOUS

## 2021-11-14 ENCOUNTER — Telehealth: Payer: Self-pay

## 2021-11-14 ENCOUNTER — Telehealth: Payer: Self-pay | Admitting: *Deleted

## 2021-11-14 NOTE — Telephone Encounter (Signed)
No answer for post procedure call back. Left VM. 

## 2021-11-14 NOTE — Telephone Encounter (Signed)
Called # [phone] and left a message we tried to reach pt for a follow up call. maw

## 2021-11-15 ENCOUNTER — Encounter: Payer: Self-pay | Admitting: Internal Medicine

## 2021-11-17 ENCOUNTER — Ambulatory Visit (INDEPENDENT_AMBULATORY_CARE_PROVIDER_SITE_OTHER): Payer: Self-pay | Admitting: Family

## 2021-11-17 ENCOUNTER — Encounter: Payer: Self-pay | Admitting: Family

## 2021-11-17 ENCOUNTER — Other Ambulatory Visit: Payer: Self-pay | Admitting: Family

## 2021-11-17 ENCOUNTER — Other Ambulatory Visit: Payer: Self-pay

## 2021-11-17 VITALS — BP 119/76 | HR 70 | Temp 98.3°F | Resp 18 | Ht 73.5 in | Wt 159.0 lb

## 2021-11-17 DIAGNOSIS — Z0001 Encounter for general adult medical examination with abnormal findings: Secondary | ICD-10-CM

## 2021-11-17 DIAGNOSIS — Z131 Encounter for screening for diabetes mellitus: Secondary | ICD-10-CM

## 2021-11-17 DIAGNOSIS — G4700 Insomnia, unspecified: Secondary | ICD-10-CM

## 2021-11-17 DIAGNOSIS — Z13220 Encounter for screening for lipoid disorders: Secondary | ICD-10-CM

## 2021-11-17 DIAGNOSIS — Z13228 Encounter for screening for other metabolic disorders: Secondary | ICD-10-CM

## 2021-11-17 DIAGNOSIS — Z1159 Encounter for screening for other viral diseases: Secondary | ICD-10-CM

## 2021-11-17 DIAGNOSIS — Z0000 Encounter for general adult medical examination without abnormal findings: Secondary | ICD-10-CM

## 2021-11-17 DIAGNOSIS — Z1329 Encounter for screening for other suspected endocrine disorder: Secondary | ICD-10-CM

## 2021-11-17 DIAGNOSIS — G5793 Unspecified mononeuropathy of bilateral lower limbs: Secondary | ICD-10-CM

## 2021-11-17 DIAGNOSIS — K219 Gastro-esophageal reflux disease without esophagitis: Secondary | ICD-10-CM

## 2021-11-17 DIAGNOSIS — Z130 Encounter for screening for diseases of the blood and blood-forming organs and certain disorders involving the immune mechanism: Secondary | ICD-10-CM

## 2021-11-17 MED ORDER — GABAPENTIN 300 MG PO CAPS: 300.0000 mg | ORAL_CAPSULE | ORAL | 1 refills | Status: DC

## 2021-11-17 NOTE — Progress Notes (Signed)
Pt presents for annual physical exam pt needs refill on gabapentin and pantoprazole. Pt states that he would like to start back on Lunesta to help with sleep as he was on it before

## 2021-11-17 NOTE — Patient Instructions (Signed)
Preventive Care 21-26 Years Old, Male ?Preventive care refers to lifestyle choices and visits with your health care provider that can promote health and wellness. Preventive care visits are also called wellness exams. ?What can I expect for my preventive care visit? ?Counseling ?During your preventive care visit, your health care provider may ask about your: ?Medical history, including: ?Past medical problems. ?Family medical history. ?Current health, including: ?Emotional well-being. ?Home life and relationship well-being. ?Sexual activity. ?Lifestyle, including: ?Alcohol, nicotine or tobacco, and drug use. ?Access to firearms. ?Diet, exercise, and sleep habits. ?Safety issues such as seatbelt and bike helmet use. ?Sunscreen use. ?Work and work environment. ?Physical exam ?Your health care provider may check your: ?Height and weight. These may be used to calculate your BMI (body mass index). BMI is a measurement that tells if you are at a healthy weight. ?Waist circumference. This measures the distance around your waistline. This measurement also tells if you are at a healthy weight and may help predict your risk of certain diseases, such as type 2 diabetes and high blood pressure. ?Heart rate and blood pressure. ?Body temperature. ?Skin for abnormal spots. ?What immunizations do I need? ?Vaccines are usually given at various ages, according to a schedule. Your health care provider will recommend vaccines for you based on your age, medical history, and lifestyle or other factors, such as travel or where you work. ?What tests do I need? ?Screening ?Your health care provider may recommend screening tests for certain conditions. This may include: ?Lipid and cholesterol levels. ?Diabetes screening. This is done by checking your blood sugar (glucose) after you have not eaten for a while (fasting). ?Hepatitis B test. ?Hepatitis C test. ?HIV (human immunodeficiency virus) test. ?STI (sexually transmitted infection)  testing, if you are at risk. ?Talk with your health care provider about your test results, treatment options, and if necessary, the need for more tests. ?Follow these instructions at home: ?Eating and drinking ? ?Eat a healthy diet that includes fresh fruits and vegetables, whole grains, lean protein, and low-fat dairy products. ?Drink enough fluid to keep your urine pale yellow. ?Take vitamin and mineral supplements as recommended by your health care provider. ?Do not drink alcohol if your health care provider tells you not to drink. ?If you drink alcohol: ?Limit how much you have to 0-2 drinks a day. ?Know how much alcohol is in your drink. In the [place] one drink equals one 12 oz bottle of beer (355 mL), one 5 oz glass of wine (148 mL), or one 1? oz glass of hard liquor (44 mL). ?Lifestyle ?Brush your teeth every morning and night with fluoride toothpaste. Floss one time each day. ?Exercise for at least 30 minutes 5 or more days each week. ?Do not use any products that contain nicotine or tobacco. These products include cigarettes, chewing tobacco, and vaping devices, such as e-cigarettes. If you need help quitting, ask your health care provider. ?Do not use drugs. ?If you are sexually active, practice safe sex. Use a condom or other form of protection to prevent STIs. ?Find healthy ways to manage stress, such as: ?Meditation, yoga, or listening to music. ?Journaling. ?Talking to a trusted person. ?Spending time with friends and family. ?Minimize exposure to UV radiation to reduce your risk of skin cancer. ?Safety ?Always wear your seat belt while driving or riding in a vehicle. ?Do not drive: ?If you have been drinking alcohol. Do not ride with someone who has been drinking. ?If you have been using any mind-altering substances or   drugs. ?While texting. ?When you are tired or distracted. ?Wear a helmet and other protective equipment during sports activities. ?If you have firearms in your house, make sure you  follow all gun safety procedures. ?Seek help if you have been physically or sexually abused. ?What's next? ?Go to your health care provider once a year for an annual wellness visit. ?Ask your health care provider how often you should have your eyes and teeth checked. ?Stay up to date on all vaccines. ?This information is not intended to replace advice given to you by your health care provider. Make sure you discuss any questions you have with your health care provider. ?Document Revised: 05/17/2021 Document Reviewed: 05/17/2021 ?Elsevier Patient Education ? 2022 [employer] ? ?

## 2021-11-18 LAB — LIPID PANEL
Chol/HDL Ratio: 2.9 ratio (ref 0.0–5.0)
Cholesterol, Total: 185 mg/dL (ref 100–199)
HDL: 64 mg/dL (ref >39)
LDL Chol Calc (NIH): 105 mg/dL — ABNORMAL HIGH (ref 0–99)
Triglycerides: 89 mg/dL (ref 0–149)
VLDL Cholesterol Cal: 16 mg/dL (ref 5–40)

## 2021-11-18 LAB — TSH: TSH: 1.95 u[IU]/mL (ref 0.450–4.500)

## 2021-11-18 LAB — HEMOGLOBIN A1C
Est. average glucose Bld gHb Est-mCnc: 103 mg/dL
Hgb A1c MFr Bld: 5.2 % (ref 4.8–5.6)

## 2021-11-18 LAB — HEPATITIS C ANTIBODY: Hep C Virus Ab: <0.1 s/co ratio (ref 0.0–0.9)

## 2021-11-18 NOTE — Progress Notes (Signed)
Thyroid function normal.   No diabetes.   LDL cholesterol (sometimes called "bad cholesterol") mildly higher than normal. Consider eating more fruits, vegetables, and lean baked meats such as chicken or fish. Moderate intensity exercise at least 150 minutes as tolerated per week may help as well. No medication needed at the moment. Encouraged to recheck fasting cholesterol in 3 to 6 months.

## 2021-11-18 NOTE — Progress Notes (Signed)
Hepatitis C negative

## 2022-01-09 ENCOUNTER — Ambulatory Visit: Payer: Self-pay | Admitting: Internal Medicine

## 2022-01-19 ENCOUNTER — Other Ambulatory Visit: Payer: Self-pay

## 2022-01-19 DIAGNOSIS — G5793 Unspecified mononeuropathy of bilateral lower limbs: Secondary | ICD-10-CM

## 2022-01-19 MED ORDER — GABAPENTIN 300 MG PO CAPS: 300.0000 mg | ORAL_CAPSULE | ORAL | 1 refills | Status: AC

## 2022-03-12 ENCOUNTER — Other Ambulatory Visit: Payer: Self-pay | Admitting: Family

## 2022-03-12 DIAGNOSIS — G5793 Unspecified mononeuropathy of bilateral lower limbs: Secondary | ICD-10-CM

## 2022-09-06 ENCOUNTER — Encounter (HOSPITAL_COMMUNITY): Payer: Self-pay

## 2022-09-06 ENCOUNTER — Emergency Department (HOSPITAL_COMMUNITY): Payer: Commercial Managed Care - HMO

## 2022-09-06 ENCOUNTER — Emergency Department (HOSPITAL_COMMUNITY)
Admission: EM | Admit: 2022-09-06 | Discharge: 2022-09-06 | Disposition: A | Discharge status: Home / Self Care | Payer: Commercial Managed Care - HMO | Attending: Emergency Medicine | Admitting: Emergency Medicine

## 2022-09-06 DIAGNOSIS — Y9351 Activity, roller skating (inline) and skateboarding: Secondary | ICD-10-CM | POA: Diagnosis not present

## 2022-09-06 DIAGNOSIS — S42432A Displaced fracture (avulsion) of lateral epicondyle of left humerus, initial encounter for closed fracture: Secondary | ICD-10-CM | POA: Insufficient documentation

## 2022-09-06 DIAGNOSIS — S59902A Unspecified injury of left elbow, initial encounter: Secondary | ICD-10-CM | POA: Diagnosis present

## 2022-09-06 MED ORDER — LORAZEPAM 1 MG PO TABS
1.0000 mg | ORAL_TABLET | Freq: Once | ORAL | Status: AC
  Administered 2022-09-06: 1 mg via ORAL
  Filled 2022-09-06: qty 1

## 2022-09-06 MED ORDER — OXYCODONE-ACETAMINOPHEN 5-325 MG PO TABS
1.0000 | ORAL_TABLET | Freq: Once | ORAL | Status: AC
  Administered 2022-09-06: 1 via ORAL
  Filled 2022-09-06: qty 1

## 2022-09-06 MED ORDER — ACETAMINOPHEN 500 MG PO TABS: 500.0000 mg | ORAL_TABLET | Freq: Four times a day (QID) | ORAL | 0 refills | Status: AC | PRN

## 2022-09-06 NOTE — ED Triage Notes (Signed)
Pt presents with c/o left elbow injury after falling today. Pt has swelling to that area.

## 2022-09-06 NOTE — Discharge Instructions (Signed)
Go to the orthopedic office first thing tomorrow morning.  Dr. Griffin Basil is the physician who will see you.  I have sent 800 mg ibuprofen to your pharmacy

## 2022-09-06 NOTE — ED Provider Notes (Signed)
[facility] DEPT Provider Note   CSN: [id number] Arrival date & time: 09/06/22  1843     History  Chief Complaint  Patient presents with   Elbow Injury    Dalton Johnson is a 26 y.o. male presenting with a left elbow injury.  Reports that he fell off his skateboard onto an outstretched left arm.  No numbness or tingling, just severe swelling and pain.  HPI     Home Medications Prior to Admission medications   Medication Sig Start Date End Date Taking? Authorizing Provider  gabapentin (NEURONTIN) 300 MG capsule Take 1 capsule (300 mg total) by mouth See admin instructions. Take 300mg  (1 tablet total) three times daily and 600 mg (2 tablets total) at bedtime. 01/19/22   Camillia Herter, NP  pantoprazole (PROTONIX) 40 MG tablet Take 1 tablet (40 mg total) by mouth 2 (two) times daily. 11/06/21 02/04/22  Sharyn Creamer, MD  PARoxetine (PAXIL) 30 MG tablet Take 30 mg by mouth daily. Patient not taking: Reported on 11/10/2021 04/08/19   [provider]      Allergies    Patient has no known allergies.    Review of Systems   Review of Systems  Physical Exam Updated Vital Signs BP (!) 149/105 (BP Location: Right Arm)   Pulse (!) 55   Temp 97.9 F (36.6 C) (Oral)   Resp 18   SpO2 100%  Physical Exam Vitals and nursing note reviewed.  Constitutional:      Appearance: Normal appearance.  HENT:     Head: Normocephalic and atraumatic.  Eyes:     General: No scleral icterus.    Conjunctiva/sclera: Conjunctivae normal.  Cardiovascular:     Comments: Strong radial pulse, sensation intact.  Full range of motion of the MCPs, wrist and shoulder.  Able to flex and extend the elbow however endorses pain. Pulmonary:     Effort: Pulmonary effort is normal. No respiratory distress.  Skin:    Findings: No rash.  Neurological:     Mental Status: He is alert.  Psychiatric:        Mood and Affect: Mood normal.     ED Results / Procedures /  Treatments   Labs (all labs ordered are listed, but only abnormal results are displayed) Labs Reviewed - No data to display  EKG None  Radiology DG Elbow Complete Left  Result Date: 09/06/2022 CLINICAL DATA:  Fall, left elbow injury EXAM: LEFT ELBOW - COMPLETE 3+ VIEW COMPARISON:  None Available. FINDINGS: Acute comminuted nondisplaced intra-articular fracture of the lateral condyle/epicondyle of the distal left humerus with left elbow joint effusion. No dislocation. No focal osseous lesions. No radiopaque foreign bodies. IMPRESSION: Acute comminuted nondisplaced intra-articular fracture of the lateral condyle/epicondyle of the left distal humerus with left elbow joint effusion. Electronically Signed   By: Ilona Sorrel M.D.   On: 09/06/2022 19:28    Procedures Procedures   Medications Ordered in ED Medications  oxyCODONE-acetaminophen (PERCOCET/ROXICET) 5-325 MG per tablet 1 tablet (1 tablet Oral Given 09/06/22 1857)    ED Course/ Medical Decision Making/ A&P                           Medical Decision Making Amount and/or Complexity of Data Reviewed Radiology: ordered.  Risk OTC drugs. Prescription drug management.  26 year old male presenting after fall on his skateboard with severe elbow pain.   This is not an exhaustive differential.  Past Medical History / Co-morbidities / Social History: Heroin use, reports he no longer uses drugs   Physical Exam: Pertinent physical exam findings include Neurovascularly intact.  Full range of motion of MCPs wrist shoulders and able to range the elbow however screams with pain  Lab Tests: None indicated   Imaging Studies: I ordered and independently visualized and interpreted elbow x-ray.  I agree with the radiologist that there is a comminuted intra-articular fracture.  CT ordered for orthopedics    Medications: I ordered medication including percocet & ativan. Reevaluation of the patient after these medicines showed that the  patient improved.   Consultations Obtained: I spoke with Rayfield Citizen with orthopedics and she recommends CT of the elbow and a posterior long-arm splint and discharged for outpatient follow-up at 8 AM.   MDM/Disposition: This is a 26 year old male presenting with a left elbow injury.  Reports he fell while skateboarding.  X-ray revealing of a distal humerus/condylar fracture.  Spoke with orthopedics who recommended a posterior long-arm splint and discharged for follow-up tomorrow.  Patient is agreeable.  CT was ordered but we will not wait for the result.   Of note, patient has been hysterical throughout his evaluation.  Screaming, crying and rolling on the ground.  His girlfriend will come and pick him up   Final Clinical Impression(s) / ED Diagnoses Final diagnoses:  Closed displaced fracture of lateral epicondyle of left humerus, unspecified fracture morphology, initial encounter    Rx / DC Orders ED Discharge Orders          Ordered    acetaminophen (TYLENOL) 500 MG tablet  Every 6 hours PRN        09/06/22 2026           Results and diagnoses were explained to the patient. Return precautions discussed in full. Patient had no additional questions and expressed complete understanding.   This chart was dictated using voice recognition software.  Despite best efforts to proofread,  errors can occur which can change the documentation meaning.    Woodroe Chen 09/06/22 2143    Glynn Octave, MD 09/07/22 [phone]

## 2022-09-27 ENCOUNTER — Other Ambulatory Visit: Payer: Self-pay

## 2022-09-27 DIAGNOSIS — Z5321 Procedure and treatment not carried out due to patient leaving prior to being seen by health care provider: Secondary | ICD-10-CM | POA: Diagnosis not present

## 2022-09-27 DIAGNOSIS — M79644 Pain in right finger(s): Secondary | ICD-10-CM | POA: Diagnosis present

## 2022-09-27 DIAGNOSIS — L03011 Cellulitis of right finger: Secondary | ICD-10-CM | POA: Diagnosis not present

## 2022-09-28 ENCOUNTER — Encounter (HOSPITAL_COMMUNITY): Payer: Self-pay | Admitting: Emergency Medicine

## 2022-09-28 ENCOUNTER — Emergency Department (HOSPITAL_COMMUNITY)
Admission: EM | Admit: 2022-09-28 | Discharge: 2022-09-28 | Discharge status: Against Medical Advice | Payer: Commercial Managed Care - HMO | Attending: Emergency Medicine | Admitting: Emergency Medicine

## 2022-09-28 ENCOUNTER — Other Ambulatory Visit: Payer: Self-pay

## 2022-09-28 LAB — BASIC METABOLIC PANEL
Anion gap: 12 (ref 5–15)
BUN: 18 mg/dL (ref 6–20)
CO2: 23 mmol/L (ref 22–32)
Calcium: 9.4 mg/dL (ref 8.9–10.3)
Chloride: 102 mmol/L (ref 98–111)
Creatinine, Ser: 1.17 mg/dL (ref 0.61–1.24)
GFR, Estimated: >60 mL/min (ref >60)
Glucose, Bld: 101 mg/dL — ABNORMAL HIGH (ref 70–99)
Potassium: 3.7 mmol/L (ref 3.5–5.1)
Sodium: 137 mmol/L (ref 135–145)

## 2022-09-28 LAB — CBC WITH DIFFERENTIAL/PLATELET
Abs Immature Granulocytes: 0.02 10*3/uL (ref 0.00–0.07)
Basophils Absolute: 0.1 10*3/uL (ref 0.0–0.1)
Basophils Relative: 1 %
Eosinophils Absolute: 0.2 10*3/uL (ref 0.0–0.5)
Eosinophils Relative: 2 %
HCT: 40.2 % (ref 39.0–52.0)
Hemoglobin: 14.2 g/dL (ref 13.0–17.0)
Immature Granulocytes: 0 %
Lymphocytes Relative: 18 %
Lymphs Abs: 1.5 10*3/uL (ref 0.7–4.0)
MCH: 30.1 pg (ref 26.0–34.0)
MCHC: 35.3 g/dL (ref 30.0–36.0)
MCV: 85.2 fL (ref 80.0–100.0)
Monocytes Absolute: 0.6 10*3/uL (ref 0.1–1.0)
Monocytes Relative: 7 %
Neutro Abs: 5.8 10*3/uL (ref 1.7–7.7)
Neutrophils Relative %: 72 %
Platelets: 249 10*3/uL (ref 150–400)
RBC: 4.72 MIL/uL (ref 4.22–5.81)
RDW: 11.9 % (ref 11.5–15.5)
WBC: 8.1 10*3/uL (ref 4.0–10.5)
nRBC: 0 % (ref 0.0–0.2)

## 2022-09-28 NOTE — ED Provider Triage Note (Signed)
Emergency Medicine Provider Triage Evaluation Note  Dalton Johnson , a 26 y.o. male  was evaluated in triage.  Pt complains of R finger pain and swelling which has been constant and worsening. Started to notice increase redness to his hand today. No fevers. Taking tylenol with no relief.  Review of Systems  Positive: As above Negative: As above  Physical Exam  BP 134/79 (BP Location: Right Arm)   Pulse (!) 101   Temp 98.1 F (36.7 C) (Oral)   Resp 15   SpO2 99%  Gen:   Awake, no distress   Resp:  Normal effort  MSK:   Moves extremities without difficulty  Other:  Swelling at the base of the nail of the R 3rd finger without obvious drainable collection; no felon. Lymphangitic streaking proximally along dorsum of R hand.     Medical Decision Making  Medically screening exam initiated at 12:37 AM.  Appropriate orders placed.  Dalton Johnson was informed that the remainder of the evaluation will be completed by another provider, this initial triage assessment does not replace that evaluation, and the importance of remaining in the ED until their evaluation is complete.  Nailbed infection without obvious drainable fluid collection; neurovascularly intact.    Antonietta Breach, PA-C 09/28/22 0045

## 2022-09-28 NOTE — ED Notes (Signed)
PT decided to leave AMA. 9:25

## 2022-09-28 NOTE — ED Triage Notes (Addendum)
Patient presents with paronychia of the right middle finger.  Area around nail is swollen and red, painful.  Patient does have some streaking up into dorsum of hand.

## 2024-11-12 ENCOUNTER — Emergency Department (HOSPITAL_COMMUNITY)
Admission: EM | Admit: 2024-11-12 | Discharge: 2024-11-12 | Discharge status: Against Medical Advice | Attending: Emergency Medicine | Admitting: Emergency Medicine

## 2024-11-12 ENCOUNTER — Ambulatory Visit (HOSPITAL_COMMUNITY)
Admission: EM | Admit: 2024-11-12 | Discharge: 2024-11-12 | Disposition: A | Discharge status: Home / Self Care | Source: Home / Self Care

## 2024-11-12 DIAGNOSIS — R112 Nausea with vomiting, unspecified: Secondary | ICD-10-CM | POA: Diagnosis not present

## 2024-11-12 DIAGNOSIS — F1290 Cannabis use, unspecified, uncomplicated: Secondary | ICD-10-CM | POA: Diagnosis not present

## 2024-11-12 DIAGNOSIS — F411 Generalized anxiety disorder: Secondary | ICD-10-CM | POA: Diagnosis not present

## 2024-11-12 DIAGNOSIS — R1084 Generalized abdominal pain: Secondary | ICD-10-CM | POA: Diagnosis present

## 2024-11-12 DIAGNOSIS — F1120 Opioid dependence, uncomplicated: Secondary | ICD-10-CM

## 2024-11-12 DIAGNOSIS — R109 Unspecified abdominal pain: Secondary | ICD-10-CM

## 2024-11-12 DIAGNOSIS — Z5329 Procedure and treatment not carried out because of patient's decision for other reasons: Secondary | ICD-10-CM | POA: Diagnosis not present

## 2024-11-12 LAB — COMPREHENSIVE METABOLIC PANEL WITH GFR
ALT: 48 U/L — ABNORMAL HIGH (ref 0–44)
AST: 36 U/L (ref 15–41)
Albumin: 4.7 g/dL (ref 3.5–5.0)
Alkaline Phosphatase: 68 U/L (ref 38–126)
Anion gap: 13 (ref 5–15)
BUN: 17 mg/dL (ref 6–20)
CO2: 22 mmol/L (ref 22–32)
Calcium: 9.4 mg/dL (ref 8.9–10.3)
Chloride: 103 mmol/L (ref 98–111)
Creatinine, Ser: 1.17 mg/dL (ref 0.61–1.24)
GFR, Estimated: >60 mL/min (ref >60)
Glucose, Bld: 107 mg/dL — ABNORMAL HIGH (ref 70–99)
Potassium: 3.7 mmol/L (ref 3.5–5.1)
Sodium: 139 mmol/L (ref 135–145)
Total Bilirubin: 0.7 mg/dL (ref 0.0–1.2)
Total Protein: 7.1 g/dL (ref 6.5–8.1)

## 2024-11-12 LAB — URINALYSIS, ROUTINE W REFLEX MICROSCOPIC
Bilirubin Urine: NEGATIVE
Glucose, UA: NEGATIVE mg/dL
Hgb urine dipstick: NEGATIVE
Ketones, ur: 5 mg/dL — AB
Leukocytes,Ua: NEGATIVE
Nitrite: NEGATIVE
Protein, ur: NEGATIVE mg/dL
Specific Gravity, Urine: 1.016 (ref 1.005–1.030)
pH: 8 (ref 5.0–8.0)

## 2024-11-12 LAB — CBC WITH DIFFERENTIAL/PLATELET
Abs Immature Granulocytes: 0.01 K/uL (ref 0.00–0.07)
Basophils Absolute: 0 K/uL (ref 0.0–0.1)
Basophils Relative: 1 %
Eosinophils Absolute: 0.1 K/uL (ref 0.0–0.5)
Eosinophils Relative: 2 %
HCT: 44.9 % (ref 39.0–52.0)
Hemoglobin: 15.8 g/dL (ref 13.0–17.0)
Immature Granulocytes: 0 %
Lymphocytes Relative: 15 %
Lymphs Abs: 1 K/uL (ref 0.7–4.0)
MCH: 30.2 pg (ref 26.0–34.0)
MCHC: 35.2 g/dL (ref 30.0–36.0)
MCV: 85.9 fL (ref 80.0–100.0)
Monocytes Absolute: 0.4 K/uL (ref 0.1–1.0)
Monocytes Relative: 6 %
Neutro Abs: 5.2 K/uL (ref 1.7–7.7)
Neutrophils Relative %: 76 %
Platelets: 240 K/uL (ref 150–400)
RBC: 5.23 MIL/uL (ref 4.22–5.81)
RDW: 12.2 % (ref 11.5–15.5)
WBC: 6.8 K/uL (ref 4.0–10.5)
nRBC: 0 % (ref 0.0–0.2)

## 2024-11-12 LAB — LIPASE, BLOOD: Lipase: 27 U/L (ref 11–51)

## 2024-11-12 LAB — URINE DRUG SCREEN
Amphetamines: NEGATIVE
Barbiturates: NEGATIVE
Benzodiazepines: NEGATIVE
Cocaine: NEGATIVE
Fentanyl: NEGATIVE
Methadone Scn, Ur: NEGATIVE
Opiates: NEGATIVE
Tetrahydrocannabinol: POSITIVE — AB

## 2024-11-12 LAB — ETHANOL: Alcohol, Ethyl (B): <15 mg/dL (ref <15)

## 2024-11-12 MED ORDER — SODIUM CHLORIDE 0.9 % IV BOLUS
1000.0000 mL | Freq: Once | INTRAVENOUS | Status: AC
  Administered 2024-11-12: 1000 mL via INTRAVENOUS

## 2024-11-12 MED ORDER — DROPERIDOL 2.5 MG/ML IJ SOLN
1.2500 mg | Freq: Once | INTRAMUSCULAR | Status: AC
  Administered 2024-11-12: 1.25 mg via INTRAVENOUS
  Filled 2024-11-12: qty 2

## 2024-11-12 MED ORDER — DIPHENHYDRAMINE HCL 50 MG/ML IJ SOLN
12.5000 mg | Freq: Once | INTRAMUSCULAR | Status: AC
  Administered 2024-11-12: 12.5 mg via INTRAVENOUS
  Filled 2024-11-12: qty 1

## 2024-11-12 MED ORDER — ONDANSETRON HCL 4 MG/2ML IJ SOLN: 4.0000 mg | Freq: Once | INTRAMUSCULAR | Status: DC | PRN

## 2024-11-12 NOTE — ED Notes (Signed)
 Patient had removed all of clothing and monitoring equipment.  Replaced gown and monitoring equipment.

## 2024-11-12 NOTE — Discharge Instructions (Addendum)

## 2024-11-12 NOTE — ED Triage Notes (Signed)
 Patient arrives by EMS, bradycardic with nausea/vomiting,  Started one hour ago, states RUQ abdominal pain.  + marijuana use and took 7hydroxyamitragynine purchased at [employer].  Denies other drugs/alcohol.

## 2024-11-12 NOTE — ED Notes (Signed)
 Went by room and noted patient had left without notifying staff.  Patient did take all of belongings.

## 2024-11-12 NOTE — ED Provider Notes (Signed)
 [facility] EMERGENCY DEPARTMENT AT [facility] Provider Note   CSN: [id number] Arrival date & time: 11/12/24  9076     Patient presents with: Nausea and Emesis  HPI Dalton Johnson is a 28 y.o. male presenting for abdominal pain nausea. That started acutely this morning.  He states abdominal pain is all over his abdomen.  Reports that he takes marijuana and has recently started using kratom.  States he has been dry heaving this morning but has not thrown up.  Also reporting constipation but did have a small bowel movement yesterday.    Emesis      Prior to Admission medications  Medication Sig Start Date End Date Taking? Authorizing Provider  acetaminophen  (TYLENOL ) 500 MG tablet Take 1 tablet (500 mg total) by mouth every 6 (six) hours as needed. 09/06/22   Redwine, Madison A, PA-C  gabapentin  (NEURONTIN ) 300 MG capsule Take 1 capsule (300 mg total) by mouth See admin instructions. Take 300mg  (1 tablet total) three times daily and 600 mg (2 tablets total) at bedtime. 01/19/22   Jaycee Greig PARAS, NP  pantoprazole  (PROTONIX ) 40 MG tablet Take 1 tablet (40 mg total) by mouth 2 (two) times daily. 11/06/21 02/04/22  Dorsey, Ying C, MD  PARoxetine (PAXIL) 30 MG tablet Take 30 mg by mouth daily. Patient not taking: Reported on 11/10/2021 04/08/19   [provider]    Allergies: Patient has no known allergies.    Review of Systems  Gastrointestinal:  Positive for vomiting.    Updated Vital Signs BP 109/84   Pulse (!) 57   Temp (!) 97.4 F (36.3 C) (Other (Comment))   Resp (!) 35   SpO2 100%   Physical Exam Vitals and nursing note reviewed.  Constitutional:      General: He is awake.     Appearance: Normal appearance. He is not ill-appearing or toxic-appearing.     Comments: Sitting up in bed dry heaving  HENT:     Head: Normocephalic and atraumatic.     Mouth/Throat:     Mouth: Mucous membranes are moist.  Eyes:     General:        Right eye: No discharge.         Left eye: No discharge.     Conjunctiva/sclera: Conjunctivae normal.  Cardiovascular:     Rate and Rhythm: Normal rate and regular rhythm.     Pulses: Normal pulses.     Heart sounds: Normal heart sounds.  Pulmonary:     Effort: Pulmonary effort is normal.     Breath sounds: Normal breath sounds.  Abdominal:     General: Abdomen is flat.     Palpations: Abdomen is soft.     Tenderness: There is generalized abdominal tenderness.  Skin:    General: Skin is warm and dry.  Neurological:     General: No focal deficit present.     Mental Status: He is alert and easily aroused.  Psychiatric:        Mood and Affect: Mood normal.     (all labs ordered are listed, but only abnormal results are displayed) Labs Reviewed  COMPREHENSIVE METABOLIC PANEL WITH GFR - Abnormal; Notable for the following components:      Result Value   Glucose, Bld 107 (*)    ALT 48 (*)    All other components within normal limits  URINALYSIS, ROUTINE W REFLEX MICROSCOPIC - Abnormal; Notable for the following components:   APPearance CLOUDY (*)  Ketones, ur 5 (*)    All other components within normal limits  URINE DRUG SCREEN - Abnormal; Notable for the following components:   Tetrahydrocannabinol POSITIVE (*)    All other components within normal limits  LIPASE, BLOOD  CBC WITH DIFFERENTIAL/PLATELET  ETHANOL    EKG: EKG Interpretation Date/Time:  Thursday November 12 2024 09:50:25 EST Ventricular Rate:  48 PR Interval:  156 QRS Duration:  99 QT Interval:  462 QTC Calculation: 413 R Axis:   100  Text Interpretation: Sinus bradycardia Borderline right axis deviation Nonspecific T abnormalities, anterior leads Baseline wander in lead(s) V3 similar to older tracings when patient rate controlled, first previous EKG sinificant tachycardia compared to sinus bradycardia on current tracing Confirmed by Armenta Canning [phone]) on 11/12/2024 12:42:00 PM  Radiology: No results found.   Procedures    Medications Ordered in the ED  sodium chloride  0.9 % bolus 1,000 mL (0 mLs Intravenous Stopped 11/12/24 1215)  droperidol (INAPSINE) 2.5 MG/ML injection 1.25 mg (1.25 mg Intravenous Given 11/12/24 1009)  diphenhydrAMINE  (BENADRYL ) injection 12.5 mg (12.5 mg Intravenous Given 11/12/24 1009)                                    Medical Decision Making Amount and/or Complexity of Data Reviewed Labs: ordered.  Risk Prescription drug management.   28 year old well-appearing male presenting for acute onset of abdominal pain, nausea and dry heaving.  Exam notable for dry heaving and generalized abdominal pain.  Labs unremarkable but revealing that he is positive for THC.  Gave IV fluids Benadryl  and droperidol but before reassessment patient eloped.  I did note him walking without issue down the hall and he appeared in no acute distress listening to music with his headphones and watching something on his phone.     Final diagnoses:  Abdominal pain, unspecified abdominal location    ED Discharge Orders     None          Lang Norleen POUR, PA-C 11/12/24 1554    Armenta Canning, MD 11/12/24 1609

## 2024-11-12 NOTE — ED Provider Notes (Signed)
 [facility] Medical Screening Exam  Patient Name: Dalton Johnson MRN: [id number] Date of Evaluation: 11/12/2024 Chief Complaint: Detox from Kratom Diagnosis:  Final diagnoses:  Opioid type dependence, continuous (HCC)   History of Present illness: Dalton Johnson is a 28 y.o. 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  Patient presents to the [facility] accompanied by his fiance seeking detox from this substance.  Assessment and review of psychiatric symptoms: On assessment, patient is restless, verbalizing inability to sit still, reports being in extreme anxiety, shares that he uses 100 mg of the substance every day, last use was earlier today morning at 6 AM.  States that he uses it for its euphoric effects for recreational purposes, and also to self-medicate his anxiety.  He reports that he purchases it in the form of pills, and swallows them.  He reports that he has been using this substance every day for 1 year now, reports also using THC, 1 to 2 g daily, denies any other substances currently.  He shares that he has a remote history of abusing Xanax when he was 75 to 28 years old, currently denies use.  Patient reports a history of inpatient rehabilitation at the [facility] treatment center 10 to 11 years ago, after which he stopped using Xanax.  He reports a history of MDD and GAD, with a past hospitalization at [facility] 8 years ago, denies any other mental health related hospitalizations.  Denies any other substance use other than those mentioned above.  Patient verbalizes interest in medication assisted therapy being initiated here at the [facility].  Reports being interested in specifically methadone, and has been educated that we do not initiate this therapy here at  this location.  Patient also educated on the fact that detox from opioids is achieved at this location we are initiating a clonidine  taper and ordered supportive medications for symptomology relief of opioid related withdrawal symptoms. Patient verbalizes not being interested in clonidine , states Clonidine  doesn't do shit for nothing. Patient asked for directions to where he can be placed on MAT, and also specifically asked for directions to [facility], which were provided to him.Resources for mental health placed in his AVS.  Pt with an anxious mood, attention to personal hygiene and grooming is fair, eye contact is good, speech is clear & coherent. Thought contents are organized and logical, and pt currently denies SI/HI/AVH or paranoia. There is no evidence of delusional thoughts.  He denies first rank symptoms.  Plan: Educated on the need to return to this location as needed, provided with mental health resources on his after visit summary specifically for therapy, substance abuse.  Educated as follows: Get help right away if: You have thoughts about hurting yourself or others. Get help right away if you feel like you may hurt yourself or others, or have thoughts about taking your own life. Go to your nearest emergency room or: Call 911. Call the National Suicide Prevention Lifeline at (276) 391-7602 or 988 in the [place] This is open 24 hours a day. If youre a Veteran: Call 988 and press 1. This is open 24 hours a day. Text the [employer] at (571) 744-8879. Summary Mental health is not just the absence of mental illness. It involves understanding your emotions and behaviors, and taking steps to manage them in a healthy way. If you  have symptoms of mental or emotional distress, get help from family, friends, a health care provider, or a mental health professional. Practice good mental health behaviors such as stress management skills, self-calming skills, exercise, healthy sleeping and  eating, and supportive relationships. This information is not intended to replace advice given to you by your health care provider. Make sure you discuss any questions you have with your health care provider.  Education provided on the fact that if experiencing worsening of psychiatry symptoms including suicidal ideations, homicidal ideations, or having auditory/visual hallucinations, etc, to call 911, 988, come back to this location, or go to the nearest ER. Pt verbalized understanding.   COLUMBIA-SUICIDE SEVERITY RATING SCALE 1) Have you wished you were dead or wished you could go to sleep and not wake up? No 2) Have you actually had any thoughts of killing yourself? No  3) Have you been thinking about how you might do this? N/a  4) Have you had these thoughts and had some intention of acting on them? No  5) Have you started to work out or worked out the details of how to kill yourself? No  6) Have you ever done anything, started to do anything, or prepared to do anything to end your life?  Examples: Collected pills, obtained a gun, gave away valuables, wrote a will or suicide note,  took out pills but didnt swallow any, held a gun but changed your mind or it was grabbed from  your hand, went to the roof but didnt jump; or actually took pills, tried to shoot yourself, cut  yourself, tried to hang yourself, etc. No  If YES, ask: Was this within the past three months? N/a  # It is recommended to the patient to continue psychiatric medications as prescribed, after discharge from the hospital.    # It is recommended to the patient to follow up with your outpatient psychiatric provider and PCP.  # It was discussed with the patient, the impact of alcohol, drugs, tobacco have been there overall psychiatric and medical wellbeing, and total abstinence from substance use was recommended the patient.ed.  Flowsheet Row ED from 11/12/2024 in [facility] Most recent  reading at 11/12/2024  2:46 PM ED from 11/12/2024 in [facility] Emergency Department at [facility] Most recent reading at 11/12/2024  9:45 AM ED from 09/28/2022 in [facility] Emergency Department at [facility] Most recent reading at 09/28/2022 12:21 AM  C-SSRS RISK CATEGORY Low Risk No Risk No Risk    Psychiatric Specialty Exam  Presentation  General Appearance:Casual  Eye Contact:Fair  Speech:Clear and Coherent  Speech Volume:Normal  Handedness:Right   Mood and Affect  Mood:Depressed; Anxious  Affect:Congruent   Thought Process  Thought Processes:Coherent  Descriptions of Associations:Intact  Orientation:Full (Time, Place and Person)  Thought Content:Logical    Hallucinations:None  Ideas of Reference:None  Suicidal Thoughts:No  Homicidal Thoughts:No   Sensorium  Memory:Immediate Fair  Judgment:Fair  Insight:Fair   Executive Functions  Concentration:Fair  Attention Span:Fair  Recall:Fair  Fund of Knowledge:Fair  Language:Fair   Psychomotor Activity  Psychomotor Activity:Normal   Assets  Assets:Resilience   Sleep  Sleep:Poor  Number of hours: No data recorded  Physical Exam: Physical Exam Vitals reviewed.  Constitutional:      Appearance: Normal appearance.  HENT:     Nose: Nose normal.  Eyes:     Pupils: Pupils are equal, round, and reactive to light.  Musculoskeletal:  General: Normal range of motion.     Cervical back: Normal range of motion.  Neurological:     General: No focal deficit present.     Mental Status: He is alert and oriented to person, place, and time.  Psychiatric:        Thought Content: Thought content normal.    Review of Systems  Constitutional: Negative.   HENT: Negative.    Eyes: Negative.   Respiratory: Negative.    Cardiovascular: Negative.   Skin: Negative.   Psychiatric/Behavioral:  Positive for depression (Denies SI, denies HI, denies any plan or intent to harm  self or any one outside of this location.) and substance abuse. Negative for memory loss and suicidal ideas. The patient is nervous/anxious and has insomnia.   All other systems reviewed and are negative.  Blood pressure (!) 129/96, pulse 61, temperature 99.1 F (37.3 C), temperature source Temporal, resp. rate 18, SpO2 99%. There is no height or weight on file to calculate BMI.  Musculoskeletal: Strength & Muscle Tone: within normal limits Gait & Station: normal Patient leans: N/A   [facility] MSE Discharge Disposition for Follow up and Recommendations: Based on my evaluation I certify that psychiatric inpatient services furnished can reasonably be expected to improve the patient's condition which I recommend transfer to an appropriate accepting facility.  Based on my evaluation the patient does not appear to have an emergency medical condition and can be discharged with resources and follow up care in outpatient services for Medication Management and Individual Therapy Follow up with [facility] Residents Only  Walk-in hours for open access (medication management and therapy) are Monday - Friday 8 am to 11 am. Appointments are limited, so please arrive at 7:00 am. Upon arrival, please complete the form on the clipboard located at the front desk. If there are no clipboards available, all appointments have been filled for that day.  [facility] [address] [place]  [phone]   Donia Snell, NP 11/12/2024, 4:55 PM

## 2024-11-12 NOTE — ED Notes (Signed)
 Interrupted triage to notify MD of HR dropping to 37.
